# Patient Record
Sex: Female | Born: 1995 | Race: Black or African American | Hispanic: No | Marital: Single | Smoking: Never smoker
Health system: Southern US, Community
[De-identification: ages and names within clinical notes are randomized; demographics above are authoritative.]

## PROBLEM LIST (undated history)

## (undated) DIAGNOSIS — D649 Anemia, unspecified: Secondary | ICD-10-CM

## (undated) DIAGNOSIS — F329 Major depressive disorder, single episode, unspecified: Secondary | ICD-10-CM

## (undated) DIAGNOSIS — F32A Depression, unspecified: Secondary | ICD-10-CM

## (undated) HISTORY — DX: Anemia, unspecified: D64.9

## (undated) HISTORY — PX: NO PAST SURGERIES: SHX2092

---
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
## 2019-06-24 NOTE — L&D Delivery Note (Addendum)
Delivery Note:  IOL G1P0 at [redacted]w[redacted]d  Admitting diagnosis: IUGR (intrauterine growth restriction) affecting care of mother [O36.5990] Risks: IUGR, abnormal doppler Onset of labor: IOL started 01/09/20 at midnight Active labor 1400 IOL/Augmentation: AROM, Pitocin, Cytotec and IP Foley AROM: 01/09/2020 at 2739  Complete dilation at 01/10/2020  0535 01/10/2020 Onset of pushing at 0600 FHR second stage cat 1 and 2, small variables  Analgesia /Anesthesia intrapartum:Epidural  Pushing in various positions with CNM and L&D staff support, sister of patient present for birth and supportive, FOB and extended family present virtually on video.  Delivery of a Live born female Elijah Birth Weight: 2470 g Weight:, English: 5 lb 7.1 oz APGAR: 9, 9  Newborn Delivery   Birth date/time: 01/10/2020 09:03:00 Delivery type: Vaginal, Spontaneous      in cephalic presentation, position OA to ROT.  Nuchal Cord: No  Cord double clamped after cessation of pulsation, cut by sister.  Collection of cord blood for typing completed. Cord blood donation-None  Arterial cord blood sample-No    Placenta delivered-Spontaneous  with 3 vessels . Uterotonics: Pitocin IV bolus Placenta to pathology for IUGR. Uterine tone firm, bleeding small    2nd deg perineal laceration identified.  Episiotomy:None  Local analgesia: NA  Repair: 2.0 Vicryl in standard fashion Est. Blood Loss (mL):156.00   Complications: None   Mom to postpartum.  Baby to Couplet care / Skin to Skin.  Delivery Report:  Review the Delivery Report for details.     Signed: Neta Mends, CNM, MSN 01/10/2020, 9:43 AM

## 2019-09-29 ENCOUNTER — Other Ambulatory Visit (HOSPITAL_COMMUNITY): Payer: Self-pay | Admitting: Obstetrics and Gynecology

## 2019-09-29 LAB — OB RESULTS CONSOLE ABO/RH: RH Type: POSITIVE

## 2019-09-29 LAB — OB RESULTS CONSOLE ANTIBODY SCREEN: Antibody Screen: NEGATIVE

## 2019-09-29 LAB — OB RESULTS CONSOLE RUBELLA ANTIBODY, IGM: Rubella: IMMUNE

## 2019-09-29 LAB — OB RESULTS CONSOLE HEPATITIS B SURFACE ANTIGEN: Hepatitis B Surface Ag: NEGATIVE

## 2019-09-29 LAB — OB RESULTS CONSOLE RPR: RPR: NONREACTIVE

## 2019-09-29 LAB — OB RESULTS CONSOLE HIV ANTIBODY (ROUTINE TESTING): HIV: NONREACTIVE

## 2019-09-30 ENCOUNTER — Other Ambulatory Visit (HOSPITAL_COMMUNITY): Payer: Self-pay | Admitting: Obstetrics and Gynecology

## 2019-09-30 DIAGNOSIS — O283 Abnormal ultrasonic finding on antenatal screening of mother: Secondary | ICD-10-CM

## 2019-09-30 DIAGNOSIS — Z3A24 24 weeks gestation of pregnancy: Secondary | ICD-10-CM

## 2019-09-30 DIAGNOSIS — Z363 Encounter for antenatal screening for malformations: Secondary | ICD-10-CM

## 2019-10-10 ENCOUNTER — Encounter (HOSPITAL_COMMUNITY): Payer: Self-pay | Admitting: *Deleted

## 2019-10-11 ENCOUNTER — Ambulatory Visit (HOSPITAL_COMMUNITY): Payer: Medicaid Other

## 2019-10-11 ENCOUNTER — Encounter (HOSPITAL_COMMUNITY): Payer: Self-pay

## 2019-10-11 ENCOUNTER — Ambulatory Visit (HOSPITAL_COMMUNITY)
Admission: RE | Admit: 2019-10-11 | Discharge: 2019-10-11 | Disposition: A | Discharge status: Home / Self Care | Payer: Medicaid Other | Source: Ambulatory Visit | Attending: Obstetrics and Gynecology | Admitting: Obstetrics and Gynecology

## 2019-10-11 ENCOUNTER — Ambulatory Visit (HOSPITAL_COMMUNITY): Payer: Medicaid Other | Admitting: *Deleted

## 2019-10-11 ENCOUNTER — Other Ambulatory Visit: Payer: Self-pay

## 2019-10-11 VITALS — BP 115/64 | HR 108 | Temp 98.0°F | Ht 62.0 in

## 2019-10-11 DIAGNOSIS — Z3A24 24 weeks gestation of pregnancy: Secondary | ICD-10-CM

## 2019-10-11 DIAGNOSIS — Z363 Encounter for antenatal screening for malformations: Secondary | ICD-10-CM

## 2019-10-11 DIAGNOSIS — O359XX0 Maternal care for (suspected) fetal abnormality and damage, unspecified, not applicable or unspecified: Secondary | ICD-10-CM

## 2019-10-11 DIAGNOSIS — O283 Abnormal ultrasonic finding on antenatal screening of mother: Secondary | ICD-10-CM | POA: Diagnosis present

## 2019-10-11 DIAGNOSIS — E669 Obesity, unspecified: Secondary | ICD-10-CM

## 2019-10-11 DIAGNOSIS — O99212 Obesity complicating pregnancy, second trimester: Secondary | ICD-10-CM

## 2019-10-11 HISTORY — DX: Depression, unspecified: F32.A

## 2019-10-12 ENCOUNTER — Encounter (HOSPITAL_COMMUNITY): Payer: Self-pay | Admitting: Obstetrics and Gynecology

## 2019-10-12 ENCOUNTER — Other Ambulatory Visit (HOSPITAL_COMMUNITY): Payer: Self-pay | Admitting: Obstetrics and Gynecology

## 2019-10-20 ENCOUNTER — Other Ambulatory Visit (HOSPITAL_COMMUNITY): Payer: Self-pay | Admitting: Obstetrics and Gynecology

## 2019-10-20 DIAGNOSIS — O368990 Maternal care for other specified fetal problems, unspecified trimester, not applicable or unspecified: Secondary | ICD-10-CM

## 2019-11-04 ENCOUNTER — Other Ambulatory Visit (HOSPITAL_COMMUNITY): Payer: Self-pay | Admitting: Obstetrics and Gynecology

## 2019-11-04 ENCOUNTER — Ambulatory Visit: Payer: Medicaid Other | Admitting: *Deleted

## 2019-11-04 ENCOUNTER — Encounter: Payer: Self-pay | Admitting: *Deleted

## 2019-11-04 ENCOUNTER — Ambulatory Visit (HOSPITAL_COMMUNITY): Payer: Medicaid Other | Attending: Obstetrics and Gynecology

## 2019-11-04 ENCOUNTER — Other Ambulatory Visit: Payer: Self-pay

## 2019-11-04 VITALS — BP 126/69 | HR 86 | Temp 97.8°F

## 2019-11-04 DIAGNOSIS — O365920 Maternal care for other known or suspected poor fetal growth, second trimester, not applicable or unspecified: Secondary | ICD-10-CM

## 2019-11-04 DIAGNOSIS — Z3A27 27 weeks gestation of pregnancy: Secondary | ICD-10-CM

## 2019-11-04 DIAGNOSIS — O368990 Maternal care for other specified fetal problems, unspecified trimester, not applicable or unspecified: Secondary | ICD-10-CM | POA: Diagnosis present

## 2019-11-04 DIAGNOSIS — Z3689 Encounter for other specified antenatal screening: Secondary | ICD-10-CM | POA: Insufficient documentation

## 2019-11-04 DIAGNOSIS — O289 Unspecified abnormal findings on antenatal screening of mother: Secondary | ICD-10-CM

## 2019-11-04 DIAGNOSIS — Z362 Encounter for other antenatal screening follow-up: Secondary | ICD-10-CM | POA: Diagnosis not present

## 2019-11-04 DIAGNOSIS — E669 Obesity, unspecified: Secondary | ICD-10-CM

## 2019-11-04 DIAGNOSIS — O99212 Obesity complicating pregnancy, second trimester: Secondary | ICD-10-CM | POA: Diagnosis not present

## 2019-11-07 ENCOUNTER — Other Ambulatory Visit: Payer: Self-pay | Admitting: *Deleted

## 2019-11-07 DIAGNOSIS — O365990 Maternal care for other known or suspected poor fetal growth, unspecified trimester, not applicable or unspecified: Secondary | ICD-10-CM

## 2019-11-11 ENCOUNTER — Ambulatory Visit (HOSPITAL_BASED_OUTPATIENT_CLINIC_OR_DEPARTMENT_OTHER): Payer: Medicaid Other

## 2019-11-11 ENCOUNTER — Ambulatory Visit: Payer: Medicaid Other

## 2019-11-11 ENCOUNTER — Ambulatory Visit: Payer: Medicaid Other | Attending: Obstetrics and Gynecology | Admitting: *Deleted

## 2019-11-11 ENCOUNTER — Other Ambulatory Visit: Payer: Self-pay

## 2019-11-11 VITALS — BP 121/67 | HR 104

## 2019-11-11 DIAGNOSIS — O365990 Maternal care for other known or suspected poor fetal growth, unspecified trimester, not applicable or unspecified: Secondary | ICD-10-CM

## 2019-11-11 DIAGNOSIS — O365930 Maternal care for other known or suspected poor fetal growth, third trimester, not applicable or unspecified: Secondary | ICD-10-CM

## 2019-11-11 DIAGNOSIS — O289 Unspecified abnormal findings on antenatal screening of mother: Secondary | ICD-10-CM

## 2019-11-11 DIAGNOSIS — O99213 Obesity complicating pregnancy, third trimester: Secondary | ICD-10-CM | POA: Diagnosis not present

## 2019-11-11 DIAGNOSIS — Z3A28 28 weeks gestation of pregnancy: Secondary | ICD-10-CM | POA: Diagnosis not present

## 2019-11-11 DIAGNOSIS — E669 Obesity, unspecified: Secondary | ICD-10-CM | POA: Diagnosis not present

## 2019-11-11 DIAGNOSIS — O365920 Maternal care for other known or suspected poor fetal growth, second trimester, not applicable or unspecified: Secondary | ICD-10-CM | POA: Diagnosis present

## 2019-11-14 LAB — OB RESULTS CONSOLE HIV ANTIBODY (ROUTINE TESTING): HIV: NONREACTIVE

## 2019-11-17 ENCOUNTER — Ambulatory Visit: Payer: Medicaid Other | Attending: Obstetrics and Gynecology

## 2019-11-17 ENCOUNTER — Ambulatory Visit: Payer: Medicaid Other | Admitting: *Deleted

## 2019-11-17 ENCOUNTER — Encounter: Payer: Self-pay | Admitting: *Deleted

## 2019-11-17 ENCOUNTER — Ambulatory Visit: Payer: Medicaid Other

## 2019-11-17 ENCOUNTER — Other Ambulatory Visit: Payer: Self-pay

## 2019-11-17 VITALS — BP 108/69 | HR 99

## 2019-11-17 DIAGNOSIS — O289 Unspecified abnormal findings on antenatal screening of mother: Secondary | ICD-10-CM

## 2019-11-17 DIAGNOSIS — O365930 Maternal care for other known or suspected poor fetal growth, third trimester, not applicable or unspecified: Secondary | ICD-10-CM

## 2019-11-17 DIAGNOSIS — O365990 Maternal care for other known or suspected poor fetal growth, unspecified trimester, not applicable or unspecified: Secondary | ICD-10-CM | POA: Insufficient documentation

## 2019-11-17 DIAGNOSIS — O99213 Obesity complicating pregnancy, third trimester: Secondary | ICD-10-CM

## 2019-11-17 DIAGNOSIS — Z3A29 29 weeks gestation of pregnancy: Secondary | ICD-10-CM

## 2019-11-17 DIAGNOSIS — E669 Obesity, unspecified: Secondary | ICD-10-CM | POA: Diagnosis not present

## 2019-11-17 NOTE — Procedures (Signed)
Nicole Stewart 06-03-1996 [redacted]w[redacted]d  Fetus A Non-Stress Test Interpretation for 11/17/19  Indication: IUGR  Fetal Heart Rate A Mode: External Baseline Rate (A): 125 bpm Variability: Moderate Accelerations: 10 x 10 Decelerations: None Multiple birth?: No  Uterine Activity Mode: Palpation, Toco Contraction Frequency (min): x1 Contraction Duration (sec): 30 Contraction Quality: Mild Resting Tone Palpated: Relaxed Resting Time: Adequate  Interpretation (Fetal Testing) Nonstress Test Interpretation: Reactive Comments: Reviewed tracing with Dr. Judeth Cornfield

## 2019-11-23 ENCOUNTER — Ambulatory Visit: Payer: Medicaid Other | Attending: Obstetrics and Gynecology

## 2019-11-23 ENCOUNTER — Ambulatory Visit: Payer: Medicaid Other | Admitting: *Deleted

## 2019-11-23 ENCOUNTER — Other Ambulatory Visit: Payer: Self-pay | Admitting: *Deleted

## 2019-11-23 ENCOUNTER — Other Ambulatory Visit: Payer: Self-pay

## 2019-11-23 VITALS — BP 116/68 | HR 83

## 2019-11-23 DIAGNOSIS — O365990 Maternal care for other known or suspected poor fetal growth, unspecified trimester, not applicable or unspecified: Secondary | ICD-10-CM

## 2019-11-23 DIAGNOSIS — O365930 Maternal care for other known or suspected poor fetal growth, third trimester, not applicable or unspecified: Secondary | ICD-10-CM | POA: Diagnosis present

## 2019-11-23 DIAGNOSIS — E669 Obesity, unspecified: Secondary | ICD-10-CM

## 2019-11-23 DIAGNOSIS — O99213 Obesity complicating pregnancy, third trimester: Secondary | ICD-10-CM | POA: Diagnosis not present

## 2019-11-23 DIAGNOSIS — O289 Unspecified abnormal findings on antenatal screening of mother: Secondary | ICD-10-CM | POA: Diagnosis not present

## 2019-11-23 DIAGNOSIS — Z3A30 30 weeks gestation of pregnancy: Secondary | ICD-10-CM

## 2019-11-23 IMAGING — US US MFM OB FOLLOW-UP
1 series · 13 of 28 positions shown · non-contrast
Comparison: none

[Series 1: us mfm ob follow-up · 32 acquisitions, 13 frames shown]
[im 2/32]
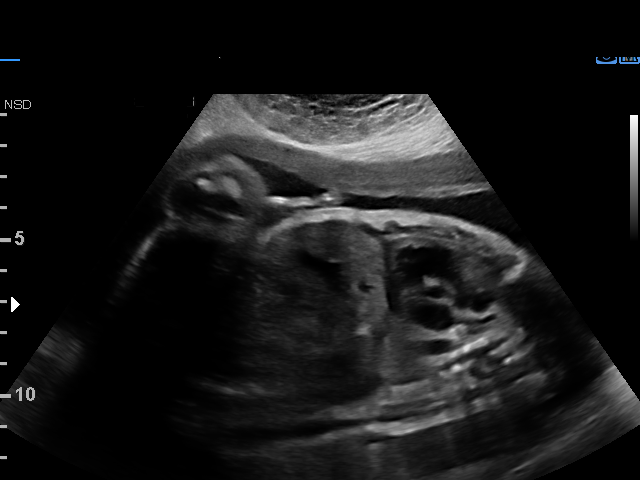
[im 4/32]
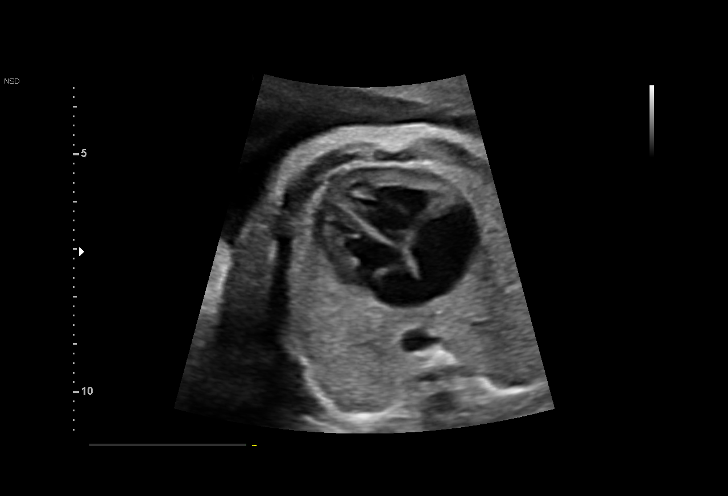
[im 6/32]
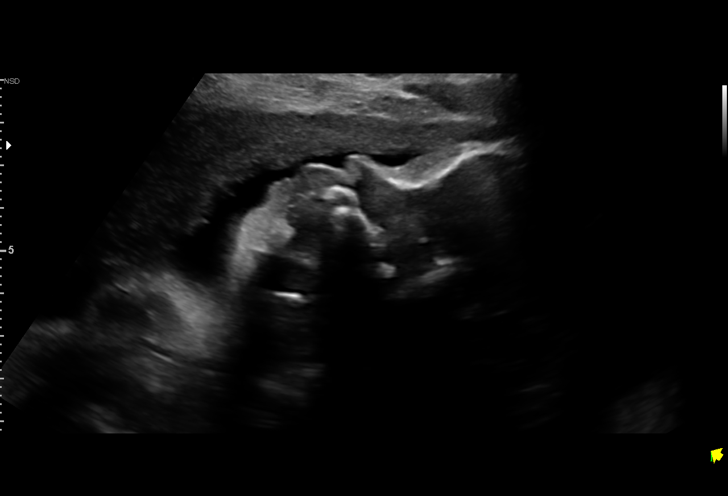
[im 9/32]
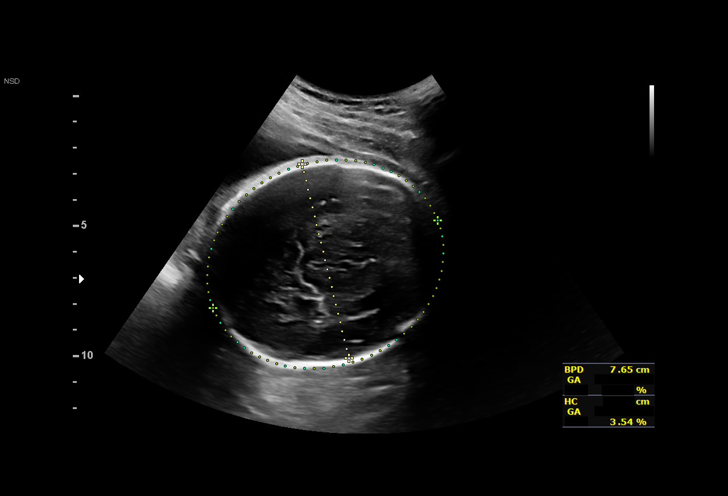
[im 11/32]
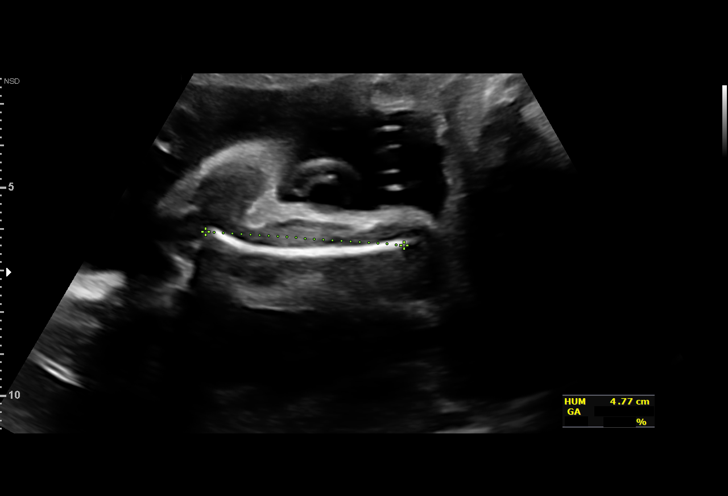
[im 13/32]
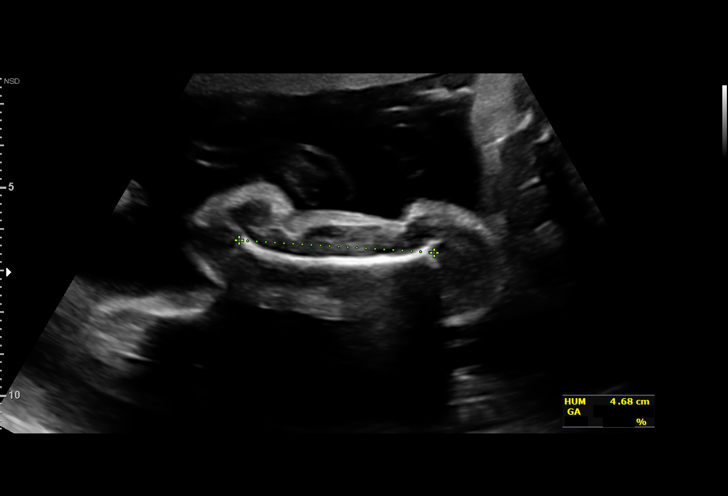
[im 17/32]
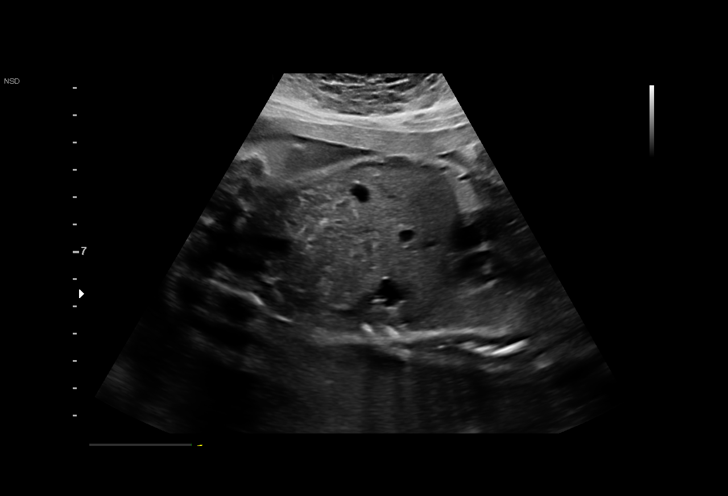
[im 19/32]
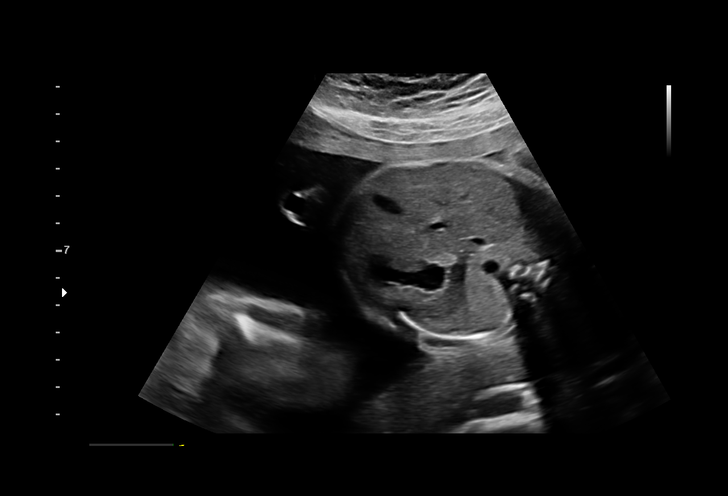
[im 21/32]
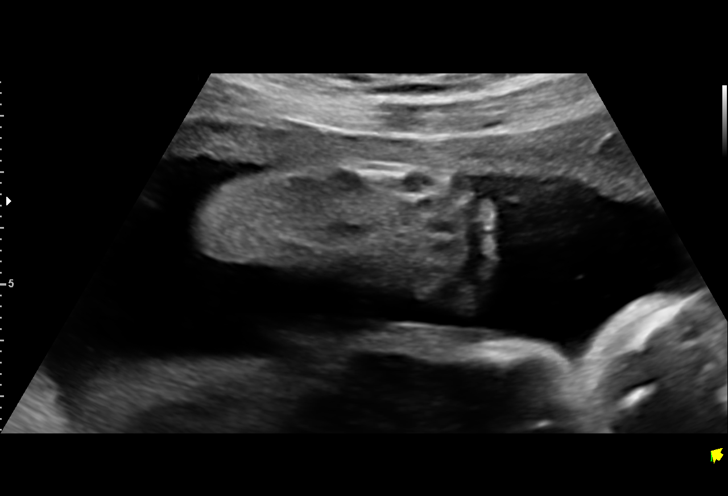
[im 23/32]
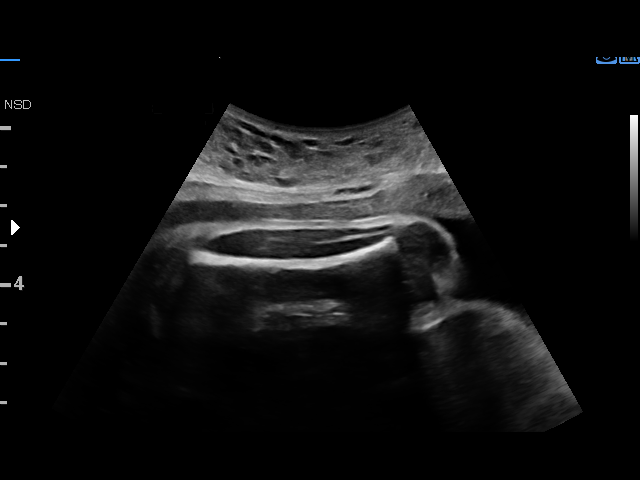
[im 26/32]
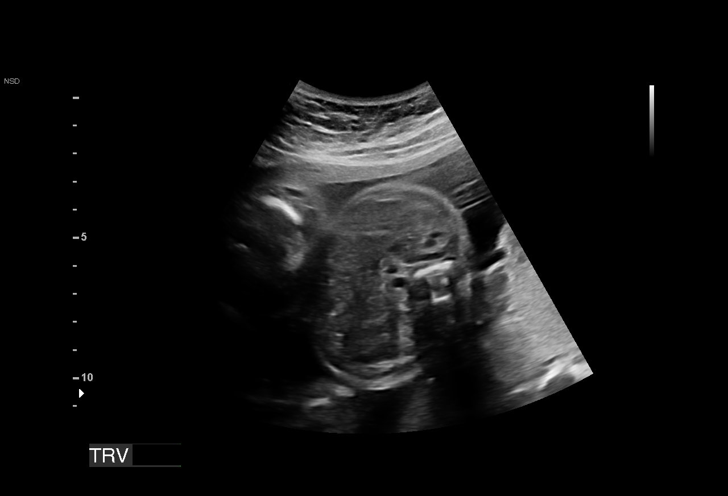
[im 28/32]
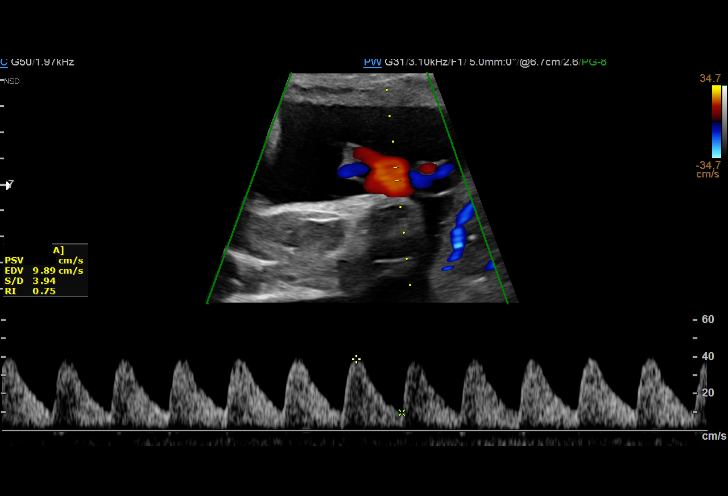
[im 30/32]
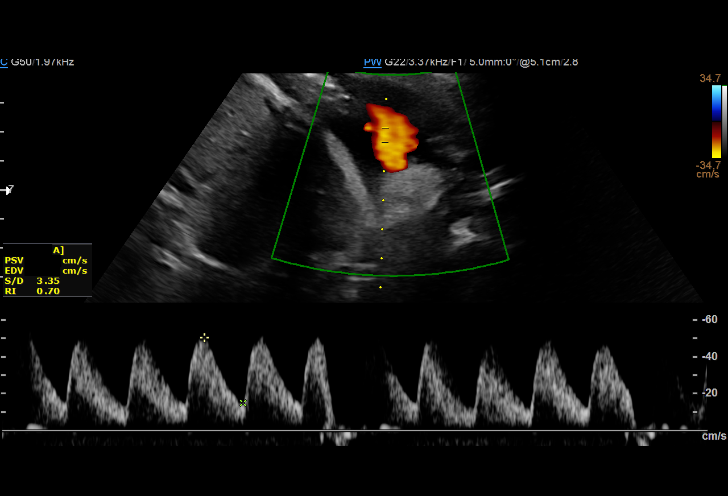

[13 of 28 positions shown; findings below may reference images not displayed]

[facility]                                        Obstetrics &
                                                              Gynecology
                                                              [KK] LIENAD
                                                              LIE[facility]D.

Indications

 Abnormal finding on antenatal screening         [KK]
 (pericardial effusion- RESOLVED, absent
 nasal bone)
 30 weeks gestation of pregnancy
 MS-AFP: Neg/   Panorama:Low risk, Male,
 FF:8.1
 Maternal care for known or suspected poor       [KK]
 fetal growth, third trimester, not applicable or
 unspecified IUGR
 Obesity complicating pregnancy, third trimester [KK]
Vital Signs

                                                 Height:        5'2"
Fetal Evaluation

 Num Of Fetuses:          1
 Fetal Heart Rate(bpm):   134
 Cardiac Activity:        Observed
 Presentation:            Cephalic
 Placenta:                Posterior
 P. Cord Insertion:       Previously Visualized
 Amniotic Fluid
 AFI FV:      Within normal limits

 AFI Sum(cm)     %Tile       Largest Pocket(cm)
 20.9            82

 RUQ(cm)       RLQ(cm)        LUQ(cm)        LLQ(cm)

Biometry

 BPD:      76.7   mm     G. Age:  30w 5d         54  %    CI:         79.81  %    70 - 86
                                                          FL/HC:       19.1  %    19.2 -
 HC:      271.3   mm     G. Age:  29w 4d          6  %    HC/AC:       1.12       0.99 -
 AC:      242.7   mm     G. Age:  28w 4d          6  %    FL/BPD:      67.5  %    71 - 87
 FL:       51.8   mm     G. Age:  27w 5d        < 1  %    FL/AC:       21.3  %    20 - 24
 HUM:      47.5   mm     G. Age:  27w 6d        < 5  %

 LV:         4.6  mm

 Est. FW:    [KK]   gm    2 lb 12 oz     3.3  %
OB History

 Gravidity:     1
Gestational Age

 LMP:            30w 2d       Date:  [DATE]                   EDD:  [DATE]
 U/S Today:      29w 1d                                         EDD:  [DATE]
 Best:           30w 2d    Det. By:  LMP  ([DATE])            EDD:  [DATE]
Anatomy

 Cranium:                Appears normal         Aortic Arch:            Previously seen
 Cavum:                  Previously seen        Ductal Arch:            Previously seen
 Ventricles:             Appears normal         Diaphragm:              Appears normal
 Choroid Plexus:         Previously seen        Stomach:                Appears normal, left
                                                                        sided
 Cerebellum:             Previously seen        Abdomen:                Appears normal
 Posterior Fossa:        Previously seen        Abdominal Wall:         Previously seen
 Nuchal Fold:            Not applicable (>20    Cord Vessels:           Previously seen
                         wks GA)
 Face:                   Orbits previously      Kidneys:                Appear normal
                         seen
 Lips:                   Previously seen        Bladder:                Appears normal
 Thoracic:               Appears normal         Spine:                  Previously seen
 Heart:                  Appears normal         Upper Extremities:      Previously seen
                         (4CH, axis, and situs)
 RVOT:                   Previously seen        Lower Extremities:      Previously seen
 LVOT:                   Previously seen

 Other:   Heels previously seen.
Doppler - Fetal Vessels

 Umbilical Artery
   S/D     %tile                                              ADFV    RDFV
  4.55    > 97.5                                                 No      No
Cervix Uterus Adnexa

 Cervix
 Not visualized (advanced GA >[KK])
Impression

 Follow up growth for IUGR
 Positive interval growth obtained with EFW 3.3 %
 Biophysical profile [DATE] with good amniotic fluid
 Abnormal UA Dopplers are elevated but no evidence of AEDF or
 REDF

 NST performed and reactive
Recommendations

 Continue weekly testing with NST and UA Dopplers
 Repeat growth in 3 weeks
 Given the EFW 3% consider delivery by 37 weeks.

## 2019-11-23 NOTE — Procedures (Signed)
Anushri Casalino Dec 01, 1995 [redacted]w[redacted]d  Fetus A Non-Stress Test Interpretation for 11/23/19  Indication: IUGR  Fetal Heart Rate A Mode: External Baseline Rate (A): 130 bpm Variability: Moderate Accelerations: 10 x 10 Decelerations: None Multiple birth?: No  Uterine Activity Mode: Palpation, Toco Contraction Frequency (min): U/I Contraction Duration (sec): 10-20 Contraction Quality: Mild(none reported) Resting Tone Palpated: Relaxed Resting Time: Adequate  Interpretation (Fetal Testing) Nonstress Test Interpretation: Reactive Comments: Reviewed tracing with Dr. Grace Bushy

## 2019-11-28 ENCOUNTER — Other Ambulatory Visit: Payer: Self-pay | Admitting: *Deleted

## 2019-11-28 DIAGNOSIS — O365930 Maternal care for other known or suspected poor fetal growth, third trimester, not applicable or unspecified: Secondary | ICD-10-CM

## 2019-11-30 ENCOUNTER — Ambulatory Visit (HOSPITAL_BASED_OUTPATIENT_CLINIC_OR_DEPARTMENT_OTHER): Payer: Medicaid Other | Admitting: *Deleted

## 2019-11-30 ENCOUNTER — Ambulatory Visit: Payer: Medicaid Other | Attending: Obstetrics and Gynecology

## 2019-11-30 ENCOUNTER — Other Ambulatory Visit: Payer: Self-pay

## 2019-11-30 ENCOUNTER — Ambulatory Visit: Payer: Medicaid Other | Admitting: *Deleted

## 2019-11-30 VITALS — BP 118/66 | HR 90

## 2019-11-30 DIAGNOSIS — Z3A31 31 weeks gestation of pregnancy: Secondary | ICD-10-CM

## 2019-11-30 DIAGNOSIS — O365931 Maternal care for other known or suspected poor fetal growth, third trimester, fetus 1: Secondary | ICD-10-CM | POA: Diagnosis not present

## 2019-11-30 DIAGNOSIS — O365990 Maternal care for other known or suspected poor fetal growth, unspecified trimester, not applicable or unspecified: Secondary | ICD-10-CM

## 2019-11-30 DIAGNOSIS — O365930 Maternal care for other known or suspected poor fetal growth, third trimester, not applicable or unspecified: Secondary | ICD-10-CM

## 2019-11-30 DIAGNOSIS — E669 Obesity, unspecified: Secondary | ICD-10-CM

## 2019-11-30 DIAGNOSIS — O99213 Obesity complicating pregnancy, third trimester: Secondary | ICD-10-CM | POA: Diagnosis not present

## 2019-11-30 DIAGNOSIS — O289 Unspecified abnormal findings on antenatal screening of mother: Secondary | ICD-10-CM | POA: Diagnosis not present

## 2019-11-30 NOTE — Procedures (Signed)
Nicole Stewart [birth year] [redacted]w[redacted]d  Fetus A Non-Stress Test Interpretation for 11/30/19  Indication: IUGR  Fetal Heart Rate A Mode: External Baseline Rate (A): 135 bpm Variability: Moderate Accelerations: 10 x 10 Decelerations: None Multiple birth?: No  Uterine Activity Mode: Palpation, Toco Contraction Frequency (min): none Resting Tone Palpated: Relaxed Resting Time: Adequate  Interpretation (Fetal Testing) Nonstress Test Interpretation: Reactive Comments: Reviewed tracing with Dr. Parke Poisson

## 2019-12-08 ENCOUNTER — Ambulatory Visit: Payer: Medicaid Other | Attending: Obstetrics and Gynecology

## 2019-12-08 ENCOUNTER — Ambulatory Visit: Payer: Medicaid Other | Admitting: *Deleted

## 2019-12-08 ENCOUNTER — Other Ambulatory Visit: Payer: Self-pay

## 2019-12-08 VITALS — BP 119/63 | HR 83

## 2019-12-08 DIAGNOSIS — O365930 Maternal care for other known or suspected poor fetal growth, third trimester, not applicable or unspecified: Secondary | ICD-10-CM | POA: Insufficient documentation

## 2019-12-08 DIAGNOSIS — O99213 Obesity complicating pregnancy, third trimester: Secondary | ICD-10-CM | POA: Diagnosis not present

## 2019-12-08 DIAGNOSIS — O365990 Maternal care for other known or suspected poor fetal growth, unspecified trimester, not applicable or unspecified: Secondary | ICD-10-CM | POA: Diagnosis present

## 2019-12-08 DIAGNOSIS — E669 Obesity, unspecified: Secondary | ICD-10-CM | POA: Diagnosis not present

## 2019-12-08 DIAGNOSIS — O289 Unspecified abnormal findings on antenatal screening of mother: Secondary | ICD-10-CM

## 2019-12-08 DIAGNOSIS — Z3A32 32 weeks gestation of pregnancy: Secondary | ICD-10-CM

## 2019-12-08 NOTE — Procedures (Signed)
Nicole Stewart 09-04-1995 [redacted]w[redacted]d  Fetus A Non-Stress Test Interpretation for 12/08/19  Indication: IUGR  Fetal Heart Rate A Mode: External Baseline Rate (A): 125 bpm Variability: Moderate Accelerations: 15 x 15 Decelerations: None Multiple birth?: No  Uterine Activity Mode: Palpation, Toco Contraction Frequency (min): none Resting Tone Palpated: Relaxed Resting Time: Adequate  Interpretation (Fetal Testing) Nonstress Test Interpretation: Reactive Comments: Reviewed tracing with Dr. Judeth Cornfield

## 2019-12-10 ENCOUNTER — Encounter (HOSPITAL_COMMUNITY): Payer: Medicaid Other

## 2019-12-15 ENCOUNTER — Ambulatory Visit: Payer: Medicaid Other | Admitting: *Deleted

## 2019-12-15 ENCOUNTER — Ambulatory Visit: Payer: Medicaid Other | Attending: Obstetrics and Gynecology

## 2019-12-15 ENCOUNTER — Other Ambulatory Visit: Payer: Self-pay

## 2019-12-15 ENCOUNTER — Other Ambulatory Visit: Payer: Self-pay | Admitting: *Deleted

## 2019-12-15 VITALS — BP 126/69 | HR 105

## 2019-12-15 DIAGNOSIS — O289 Unspecified abnormal findings on antenatal screening of mother: Secondary | ICD-10-CM | POA: Diagnosis not present

## 2019-12-15 DIAGNOSIS — O365990 Maternal care for other known or suspected poor fetal growth, unspecified trimester, not applicable or unspecified: Secondary | ICD-10-CM | POA: Insufficient documentation

## 2019-12-15 DIAGNOSIS — O365930 Maternal care for other known or suspected poor fetal growth, third trimester, not applicable or unspecified: Secondary | ICD-10-CM

## 2019-12-15 DIAGNOSIS — Z3A33 33 weeks gestation of pregnancy: Secondary | ICD-10-CM

## 2019-12-15 DIAGNOSIS — Z362 Encounter for other antenatal screening follow-up: Secondary | ICD-10-CM | POA: Diagnosis not present

## 2019-12-15 DIAGNOSIS — O99213 Obesity complicating pregnancy, third trimester: Secondary | ICD-10-CM

## 2019-12-15 DIAGNOSIS — E669 Obesity, unspecified: Secondary | ICD-10-CM

## 2019-12-15 NOTE — Procedures (Signed)
Nicole Stewart 1995-09-08 [redacted]w[redacted]d  Fetus A Non-Stress Test Interpretation for 12/15/19  Indication: IUGR  Fetal Heart Rate A Mode: External Baseline Rate (A): 135 bpm Variability: Moderate Accelerations: 15 x 15 Decelerations: None Multiple birth?: No  Uterine Activity Mode: Palpation, Toco Contraction Frequency (min): none Resting Tone Palpated: Relaxed Resting Time: Adequate  Interpretation (Fetal Testing) Nonstress Test Interpretation: Reactive Comments: Reviewed tracing with Dr. Grace Bushy

## 2019-12-21 ENCOUNTER — Ambulatory Visit: Payer: Medicaid Other | Attending: Obstetrics and Gynecology

## 2019-12-21 ENCOUNTER — Ambulatory Visit: Payer: Medicaid Other | Admitting: *Deleted

## 2019-12-21 ENCOUNTER — Other Ambulatory Visit: Payer: Self-pay

## 2019-12-21 VITALS — BP 119/64 | HR 91

## 2019-12-21 DIAGNOSIS — Z3A34 34 weeks gestation of pregnancy: Secondary | ICD-10-CM

## 2019-12-21 DIAGNOSIS — O365930 Maternal care for other known or suspected poor fetal growth, third trimester, not applicable or unspecified: Secondary | ICD-10-CM | POA: Diagnosis not present

## 2019-12-21 DIAGNOSIS — O365990 Maternal care for other known or suspected poor fetal growth, unspecified trimester, not applicable or unspecified: Secondary | ICD-10-CM | POA: Insufficient documentation

## 2019-12-21 DIAGNOSIS — O289 Unspecified abnormal findings on antenatal screening of mother: Secondary | ICD-10-CM | POA: Diagnosis not present

## 2019-12-21 DIAGNOSIS — O99213 Obesity complicating pregnancy, third trimester: Secondary | ICD-10-CM | POA: Diagnosis not present

## 2019-12-21 DIAGNOSIS — E669 Obesity, unspecified: Secondary | ICD-10-CM

## 2019-12-21 DIAGNOSIS — Z362 Encounter for other antenatal screening follow-up: Secondary | ICD-10-CM | POA: Diagnosis not present

## 2019-12-21 NOTE — Procedures (Signed)
Nicole Stewart 1995-09-29 [redacted]w[redacted]d  Fetus A Non-Stress Test Interpretation for 12/21/19  Indication: IUGR  Fetal Heart Rate A Mode: External Baseline Rate (A): 130 bpm Variability: Moderate Accelerations: 15 x 15 Decelerations: None Multiple birth?: No  Uterine Activity Mode: Palpation, Toco Contraction Frequency (min): x2 (with U/I) Contraction Duration (sec): 40-80 Contraction Quality: Mild Resting Tone Palpated: Relaxed Resting Time: Adequate  Interpretation (Fetal Testing) Nonstress Test Interpretation: Reactive Comments: Reviewed tracing with Dr. Judeth Cornfield

## 2019-12-26 ENCOUNTER — Telehealth (HOSPITAL_COMMUNITY): Payer: Self-pay | Admitting: *Deleted

## 2019-12-26 ENCOUNTER — Encounter (HOSPITAL_COMMUNITY): Payer: Self-pay | Admitting: *Deleted

## 2019-12-26 NOTE — Telephone Encounter (Signed)
Preadmission screen  

## 2020-01-03 ENCOUNTER — Other Ambulatory Visit: Payer: Self-pay | Admitting: *Deleted

## 2020-01-03 DIAGNOSIS — O365930 Maternal care for other known or suspected poor fetal growth, third trimester, not applicable or unspecified: Secondary | ICD-10-CM

## 2020-01-03 LAB — OB RESULTS CONSOLE GBS: GBS: NEGATIVE

## 2020-01-04 ENCOUNTER — Other Ambulatory Visit: Payer: Self-pay

## 2020-01-04 ENCOUNTER — Ambulatory Visit: Payer: Medicaid Other | Attending: Obstetrics and Gynecology

## 2020-01-04 ENCOUNTER — Ambulatory Visit: Payer: Medicaid Other | Admitting: *Deleted

## 2020-01-04 VITALS — BP 121/73 | HR 102

## 2020-01-04 DIAGNOSIS — O365990 Maternal care for other known or suspected poor fetal growth, unspecified trimester, not applicable or unspecified: Secondary | ICD-10-CM | POA: Diagnosis present

## 2020-01-04 DIAGNOSIS — O289 Unspecified abnormal findings on antenatal screening of mother: Secondary | ICD-10-CM

## 2020-01-04 DIAGNOSIS — Z362 Encounter for other antenatal screening follow-up: Secondary | ICD-10-CM

## 2020-01-04 DIAGNOSIS — O365930 Maternal care for other known or suspected poor fetal growth, third trimester, not applicable or unspecified: Secondary | ICD-10-CM | POA: Diagnosis not present

## 2020-01-04 DIAGNOSIS — O99213 Obesity complicating pregnancy, third trimester: Secondary | ICD-10-CM | POA: Diagnosis not present

## 2020-01-04 DIAGNOSIS — Z3A36 36 weeks gestation of pregnancy: Secondary | ICD-10-CM

## 2020-01-04 DIAGNOSIS — E669 Obesity, unspecified: Secondary | ICD-10-CM

## 2020-01-07 ENCOUNTER — Other Ambulatory Visit (HOSPITAL_COMMUNITY)
Admission: RE | Admit: 2020-01-07 | Discharge: 2020-01-07 | Disposition: A | Discharge status: Home / Self Care | Payer: Medicaid Other | Source: Ambulatory Visit | Attending: Obstetrics & Gynecology | Admitting: Obstetrics & Gynecology

## 2020-01-07 DIAGNOSIS — Z01818 Encounter for other preprocedural examination: Secondary | ICD-10-CM | POA: Diagnosis present

## 2020-01-07 DIAGNOSIS — Z20822 Contact with and (suspected) exposure to covid-19: Secondary | ICD-10-CM | POA: Diagnosis not present

## 2020-01-07 LAB — SARS CORONAVIRUS 2 (TAT 6-24 HRS): SARS Coronavirus 2: NEGATIVE

## 2020-01-09 ENCOUNTER — Inpatient Hospital Stay (HOSPITAL_COMMUNITY)
Admission: AD | Admit: 2020-01-09 | Discharge: 2020-01-12 | DRG: 806 | Disposition: A | Discharge status: Home / Self Care | Payer: Medicaid Other | Attending: Obstetrics & Gynecology | Admitting: Obstetrics & Gynecology

## 2020-01-09 ENCOUNTER — Inpatient Hospital Stay (HOSPITAL_COMMUNITY): Payer: Medicaid Other | Admitting: Anesthesiology

## 2020-01-09 ENCOUNTER — Other Ambulatory Visit: Payer: Self-pay

## 2020-01-09 ENCOUNTER — Inpatient Hospital Stay (HOSPITAL_COMMUNITY): Payer: Medicaid Other

## 2020-01-09 ENCOUNTER — Encounter (HOSPITAL_COMMUNITY): Payer: Self-pay | Admitting: Obstetrics & Gynecology

## 2020-01-09 DIAGNOSIS — O365930 Maternal care for other known or suspected poor fetal growth, third trimester, not applicable or unspecified: Secondary | ICD-10-CM | POA: Diagnosis present

## 2020-01-09 DIAGNOSIS — Z148 Genetic carrier of other disease: Secondary | ICD-10-CM

## 2020-01-09 DIAGNOSIS — D509 Iron deficiency anemia, unspecified: Secondary | ICD-10-CM | POA: Diagnosis present

## 2020-01-09 DIAGNOSIS — Z3490 Encounter for supervision of normal pregnancy, unspecified, unspecified trimester: Secondary | ICD-10-CM | POA: Diagnosis present

## 2020-01-09 DIAGNOSIS — O9902 Anemia complicating childbirth: Secondary | ICD-10-CM | POA: Diagnosis present

## 2020-01-09 DIAGNOSIS — O864 Pyrexia of unknown origin following delivery: Secondary | ICD-10-CM | POA: Diagnosis not present

## 2020-01-09 DIAGNOSIS — O365990 Maternal care for other known or suspected poor fetal growth, unspecified trimester, not applicable or unspecified: Secondary | ICD-10-CM | POA: Diagnosis present

## 2020-01-09 DIAGNOSIS — Z8659 Personal history of other mental and behavioral disorders: Secondary | ICD-10-CM

## 2020-01-09 DIAGNOSIS — Z3A37 37 weeks gestation of pregnancy: Secondary | ICD-10-CM

## 2020-01-09 LAB — CBC
HCT: 28 % — ABNORMAL LOW (ref 36.0–46.0)
Hemoglobin: 8.6 g/dL — ABNORMAL LOW (ref 12.0–15.0)
MCH: 27.4 pg (ref 26.0–34.0)
MCHC: 30.7 g/dL (ref 30.0–36.0)
MCV: 89.2 fL (ref 80.0–100.0)
Platelets: 346 10*3/uL (ref 150–400)
RBC: 3.14 MIL/uL — ABNORMAL LOW (ref 3.87–5.11)
RDW: 14.6 % (ref 11.5–15.5)
WBC: 7.8 10*3/uL (ref 4.0–10.5)
nRBC: 0 % (ref 0.0–0.2)

## 2020-01-09 LAB — RPR: RPR Ser Ql: NONREACTIVE

## 2020-01-09 LAB — TYPE AND SCREEN
ABO/RH(D): O POS
Antibody Screen: NEGATIVE

## 2020-01-09 LAB — ABO/RH: ABO/RH(D): O POS

## 2020-01-09 MED ORDER — OXYTOCIN BOLUS FROM INFUSION
333.0000 mL | Freq: Once | INTRAVENOUS | Status: AC
  Administered 2020-01-10: 333 mL via INTRAVENOUS

## 2020-01-09 MED ORDER — OXYTOCIN-SODIUM CHLORIDE 30-0.9 UT/500ML-% IV SOLN
2.5000 [IU]/h | INTRAVENOUS | Status: DC
  Filled 2020-01-09: qty 500

## 2020-01-09 MED ORDER — TERBUTALINE SULFATE 1 MG/ML IJ SOLN: 0.2500 mg | Freq: Once | INTRAMUSCULAR | Status: DC | PRN

## 2020-01-09 MED ORDER — OXYCODONE-ACETAMINOPHEN 5-325 MG PO TABS: 2.0000 | ORAL_TABLET | ORAL | Status: DC | PRN

## 2020-01-09 MED ORDER — EPHEDRINE 5 MG/ML INJ: 10.0000 mg | INTRAVENOUS | Status: DC | PRN

## 2020-01-09 MED ORDER — ONDANSETRON HCL 4 MG/2ML IJ SOLN: 4.0000 mg | Freq: Four times a day (QID) | INTRAMUSCULAR | Status: DC | PRN

## 2020-01-09 MED ORDER — SOD CITRATE-CITRIC ACID 500-334 MG/5ML PO SOLN: 30.0000 mL | ORAL | Status: DC | PRN

## 2020-01-09 MED ORDER — LACTATED RINGERS IV SOLN: 500.0000 mL | INTRAVENOUS | Status: DC | PRN

## 2020-01-09 MED ORDER — MISOPROSTOL 25 MCG QUARTER TABLET
25.0000 ug | ORAL_TABLET | ORAL | Status: DC | PRN
  Administered 2020-01-09: 25 ug via VAGINAL
  Filled 2020-01-09: qty 1

## 2020-01-09 MED ORDER — OXYTOCIN-SODIUM CHLORIDE 30-0.9 UT/500ML-% IV SOLN: 1.0000 m[IU]/min | INTRAVENOUS | Status: DC

## 2020-01-09 MED ORDER — OXYTOCIN-SODIUM CHLORIDE 30-0.9 UT/500ML-% IV SOLN
1.0000 m[IU]/min | INTRAVENOUS | Status: DC
  Administered 2020-01-09: 1 m[IU]/min via INTRAVENOUS
  Filled 2020-01-09: qty 500

## 2020-01-09 MED ORDER — PHENYLEPHRINE 40 MCG/ML (10ML) SYRINGE FOR IV PUSH (FOR BLOOD PRESSURE SUPPORT): 80.0000 ug | PREFILLED_SYRINGE | INTRAVENOUS | Status: DC | PRN

## 2020-01-09 MED ORDER — ACETAMINOPHEN 325 MG PO TABS: 650.0000 mg | ORAL_TABLET | ORAL | Status: DC | PRN

## 2020-01-09 MED ORDER — LIDOCAINE HCL (PF) 1 % IJ SOLN: 30.0000 mL | INTRAMUSCULAR | Status: DC | PRN

## 2020-01-09 MED ORDER — LACTATED RINGERS IV SOLN
500.0000 mL | Freq: Once | INTRAVENOUS | Status: AC
  Administered 2020-01-09: 700 mL via INTRAVENOUS

## 2020-01-09 MED ORDER — FENTANYL-BUPIVACAINE-NACL 0.5-0.125-0.9 MG/250ML-% EP SOLN
12.0000 mL/h | EPIDURAL | Status: DC | PRN
  Filled 2020-01-09 (×2): qty 250

## 2020-01-09 MED ORDER — LIDOCAINE HCL (PF) 1 % IJ SOLN
INTRAMUSCULAR | Status: DC | PRN
  Administered 2020-01-09: 3 mL via EPIDURAL
  Administered 2020-01-09: 4 mL via EPIDURAL

## 2020-01-09 MED ORDER — SODIUM CHLORIDE (PF) 0.9 % IJ SOLN
INTRAMUSCULAR | Status: DC | PRN
  Administered 2020-01-09: 12 mL/h via EPIDURAL

## 2020-01-09 MED ORDER — DIPHENHYDRAMINE HCL 50 MG/ML IJ SOLN: 12.5000 mg | INTRAMUSCULAR | Status: DC | PRN

## 2020-01-09 MED ORDER — LACTATED RINGERS IV SOLN: INTRAVENOUS | Status: DC

## 2020-01-09 MED ORDER — OXYCODONE-ACETAMINOPHEN 5-325 MG PO TABS: 1.0000 | ORAL_TABLET | ORAL | Status: DC | PRN

## 2020-01-09 NOTE — H&P (Signed)
OB ADMISSION/ HISTORY & PHYSICAL:  Admission Date: 01/09/2020 12:03 AM  Admit Diagnosis: IOL for IUGR  Nicole Stewart is a 24 y.o. female G1P0 [redacted]w[redacted]d presenting for IOL.   History of current pregnancy: G1P0   Patient late entry to care with [facility] at 23+3 wks.   EDC of 01/30/20 was established by LMP and congruent w/ 22+1 wk U/S.   Anatomy scan:  24+1 wks,  posterior placenta and possible normal variants: fluid around fetal heart and small nasal bone.   Antenatal testing: for IUGR and abnormal dopplers started at 28 weeks Last evaluation: 36+2 wks 6# 1oz 37% (2751 gm), AFI 10.8 Significant prenatal events:  Patient Active Problem List   Diagnosis Date Noted  . IUGR (intrauterine growth restriction) affecting care of mother 01/09/2020  . IDA (iron deficiency anemia) 01/09/2020  . History of depression 01/09/2020  . 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 01/09/2020    Prenatal Labs: ABO, Rh: O/Positive/-- (04/08 0000) Antibody: Negative (04/08 0000) Rubella: Immune (04/08 0000)  RPR: Nonreactive (04/08 0000)  HBsAg: Negative (04/08 0000)  HIV: Non-reactive (04/08 0000)  GTT: passed 1 hr  GBS: Negative/-- (07/13 0000)  GC/CHL: neg/neg Genetics: low-risk female, Horizon positive for SMA carrier, partner declined screening Tdap/influenza vaccines: declined both   OB History  Gravida Para Term Preterm AB Living  1         0  SAB TAB Ectopic Multiple Live Births               # Outcome Date GA Lbr Len/2nd Weight Sex Delivery Anes PTL Lv  1 Current             Medical / Surgical History: Past medical history:  Past Medical History:  Diagnosis Date  . Anemia   . Depression     Past surgical history:  Past Surgical History:  Procedure Laterality Date  . NO PAST SURGERIES     Family History:  Family History  Problem Relation Age of Onset  . Diabetes Mother   . Diabetes Maternal Grandfather     Social History:  reports that she has never smoked. She  has never used smokeless tobacco. She reports that she does not drink alcohol and does not use drugs.  Allergies: Patient has no known allergies.   Current Medications at time of admission:  Prior to Admission medications   Medication Sig Start Date End Date Taking? Authorizing Provider  Ferrous Sulfate (IRON PO) Take by mouth.   Yes [provider]  Prenatal Vit-Fe Fumarate-FA (PRENATAL VITAMIN PO) Take by mouth.   Yes [provider]  Pantoprazole Sodium (PROTONIX PO) Take by mouth.    [provider]  Promethazine HCl (PHENERGAN PO) Take by mouth.    [provider]    Review of Systems: Constitutional: Negative   HENT: Negative   Eyes: Negative   Respiratory: Negative   Cardiovascular: Negative   Gastrointestinal: Negative  Genitourinary: neg for bloody show, neg for LOF   Musculoskeletal: Negative   Skin: Negative   Neurological: Negative   Endo/Heme/Allergies: Negative   Psychiatric/Behavioral: Negative    Physical Exam: VS: Blood pressure 121/82, pulse (!) 108, temperature 98.3 F (36.8 C), temperature source Oral, resp. rate 16, height 5\' 1"  (1.549 m), weight 90.8 kg, last menstrual period 04/25/2019. AAO x3, no signs of distress Cardiovascular: RRR Respiratory: unlabored GU/GI: Abdomen gravid, non-tender, non-distended, active FM, vertex Extremities: no edema, negative for pain, tenderness, and cords  Cervical exam:Dilation: 1  Effacement (%): 30 Station: -3 Exam by:: [employer] RN FHR: baseline rate 125 / variability moderate / accelerations present / absent decelerations TOCO: occasioanl ctx   Prenatal Transfer Tool  Maternal Diabetes: No Genetic Screening: Normal Horizon positive for SMA carrier Maternal Ultrasounds/Referrals: IUGR Fetal Ultrasounds or other Referrals:  Referred to Materal Fetal Medicine  Maternal Substance Abuse:  No Significant Maternal Medications:  None Significant Maternal Lab Results: Group  B Strep negative    Assessment: 24 y.o. G1P0 [redacted]w[redacted]d  IOL for IUGR    -growth as low as 3.6%ile, last eval @ 36+2 wks 6# 1oz (2751g) 37%ile, BPP 8/8    -MFM rec delivery @ 37 wks for severe FGR on prev scans FHR category 1 GBS negative Pain management plan: epidural   Plan:  Admit to L&D Routine admission orders Cytotec PV Epidural PRN  Dr Dion Body to be notified of admission and plan of care  Roma Schanz MSN, CNM 01/09/2020 12:38 AM

## 2020-01-09 NOTE — Anesthesia Preprocedure Evaluation (Signed)
Anesthesia Evaluation  Patient identified by MRN, date of birth, ID band Patient awake    Reviewed: Allergy & Precautions, H&P , NPO status , Patient's Chart, lab work & pertinent test results  History of Anesthesia Complications Negative for: history of anesthetic complications  Airway Mallampati: II  TM Distance: >3 FB Neck ROM: full    Dental no notable dental hx.    Pulmonary neg pulmonary ROS,    Pulmonary exam normal        Cardiovascular negative cardio ROS Normal cardiovascular exam Rhythm:regular Rate:Normal     Neuro/Psych negative neurological ROS  negative psych ROS   GI/Hepatic negative GI ROS, Neg liver ROS,   Endo/Other  negative endocrine ROS  Renal/GU negative Renal ROS  negative genitourinary   Musculoskeletal   Abdominal   Peds  Hematology  (+) Blood dyscrasia, anemia ,   Anesthesia Other Findings   Reproductive/Obstetrics (+) Pregnancy                             Anesthesia Physical Anesthesia Plan  ASA: III  Anesthesia Plan: Epidural   Post-op Pain Management:    Induction:   PONV Risk Score and Plan:   Airway Management Planned:   Additional Equipment:   Intra-op Plan:   Post-operative Plan:   Informed Consent: I have reviewed the patients History and Physical, chart, labs and discussed the procedure including the risks, benefits and alternatives for the proposed anesthesia with the patient or authorized representative who has indicated his/her understanding and acceptance.       Plan Discussed with:   Anesthesia Plan Comments:         Anesthesia Quick Evaluation

## 2020-01-09 NOTE — Progress Notes (Signed)
Nettye Donner is a 24 y.o. G1P0 at [redacted]w[redacted]d admitted for induction of labor due to IUGR.   Subjective: Comfortable with epidural Pitocin infusing S/p cytotec and cooks; expelled at 1424    Objective: BP 129/68   Pulse 97   Temp 99 F (37.2 C) (Oral)   Resp 18   Ht 5\' 1"  (1.549 m)   Wt 90.8 kg   LMP 04/25/2019   SpO2 99%   BMI 37.83 kg/m  No intake/output data recorded. Total I/O In: -  Out: 250 [Urine:250]  FHT:  FHR: 135 bpm, variability: moderate,  accelerations:  Present,  decelerations:  Absent UC:   regular, every 1.5-3 minutes SVE: 6/75/-2 AROM 1739 Narrow pelvic inlet  Labs: Lab Results  Component Value Date   WBC 7.8 01/09/2020   HGB 8.6 (L) 01/09/2020   HCT 28.0 (L) 01/09/2020   MCV 89.2 01/09/2020   PLT 346 01/09/2020    Assessment / Plan: Induction of labor due to IUGR,  progressing well on pitocin  Labor: AROM complete. Continue pitocin per protocol.  Fetal Wellbeing:  Category I Pain Control: comfortable with epidural I/D:  GBS negative Anticipated MOD:  NSVD  01/11/2020 Antanisha Mohs MSN, CNM 01/09/2020, 5:18 PM

## 2020-01-09 NOTE — Progress Notes (Addendum)
Nicole Stewart is a 24 y.o. G1P0 at [redacted]w[redacted]d admitted for induction of labor due to IUGR.   Subjective: Contractions painful, desires epidural    Objective: BP 122/64 (BP Location: Left Arm)   Pulse 96   Temp 98.5 F (36.9 C) (Oral)   Resp 18   Ht 5\' 1"  (1.549 m)   Wt 90.8 kg   LMP 04/25/2019   SpO2 99%   BMI 37.83 kg/m  No intake/output data recorded. Total I/O In: -  Out: 250 [Urine:250]  FHT:  FHR: 135 bpm, variability: moderate,  accelerations:  Present,  decelerations:  Absent UC:   regular, every 1.5-3 minutes SVE:  3/70/-2 per RN exam with cooks in place Labs: Lab Results  Component Value Date   WBC 7.8 01/09/2020   HGB 8.6 (L) 01/09/2020   HCT 28.0 (L) 01/09/2020   MCV 89.2 01/09/2020   PLT 346 01/09/2020    Assessment / Plan: Induction of labor due to IUGR,  progressing well on pitocin  Labor: S/P Cytotec x1, cervical balloon inserted 80 ml uterine, 60 ml vaginal. Fetal Wellbeing:  Category I Pain Control: Desires epidural at this time I/D:  GBS negative Anticipated MOD:  NSVD  01/11/2020 Tiffany Calmes MSN, CNM 01/09/2020, 11:46 AM

## 2020-01-09 NOTE — Anesthesia Procedure Notes (Signed)
Epidural Patient location during procedure: OB  Staffing Anesthesiologist: Lucretia Kern, MD Performed: anesthesiologist   Preanesthetic Checklist Completed: patient identified, IV checked, risks and benefits discussed, monitors and equipment checked, pre-op evaluation and timeout performed  Epidural Patient position: sitting Prep: DuraPrep Patient monitoring: heart rate, continuous pulse ox and blood pressure Approach: midline Location: L3-L4 Injection technique: LOR air  Needle:  Needle type: Tuohy  Needle gauge: 17 G Needle length: 9 cm Needle insertion depth: 8 cm Catheter type: closed end flexible Catheter size: 19 Gauge Catheter at skin depth: 13 cm Test dose: negative  Assessment Events: blood not aspirated, injection not painful, no injection resistance, no paresthesia and negative IV test  Additional Notes Reason for block:procedure for pain

## 2020-01-09 NOTE — Progress Notes (Signed)
Nicole Stewart is a 24 y.o. G1P0 at [redacted]w[redacted]d admitted for induction of labor due to IUGR.  Subjective: Nicole Stewart is feeling intermittent pressure.  Family in room Objective: BP 120/64   Pulse (!) 109   Temp 98.7 F (37.1 C) (Oral)   Resp 17   Ht 5\' 1"  (1.549 m)   Wt 90.8 kg   LMP 04/25/2019   SpO2 99%   BMI 37.83 kg/m  I/O last 3 completed shifts: In: -  Out: 250 [Urine:250] Total I/O In: -  Out: 1125 [Urine:1125]  FHT: Category 1 FHT 135 with variability present, accels noted, some early decels UC:   regular, every 2 minutes SVE:   Dilation: 6 Effacement (%): 80 Station: -2 Exam by:: Susana Gripp CNM Pitocin at 14 mu IUPC inserted without difficulty.  Pt tolerated procedure well. Balancing completed with side lying release Assessment:  Nicole Stewart is a 24 y.o. G1P0 at [redacted]w[redacted]d admitted for induction of labor due to IUGR Cat 1 strip  Plan: Monitor progress Anticipate SVD  [redacted]w[redacted]d CNM, MSN 01/09/2020, 9:01 PM

## 2020-01-09 NOTE — Progress Notes (Addendum)
Nicole Stewart is a 24 y.o. G1P0 at [redacted]w[redacted]d admitted for induction of labor due to IUGR.   Subjective: Comfortable with epidural Pitocin infusing  Objective: BP (!) 98/53 (BP Location: Right Arm)   Pulse (!) 109   Temp 98.5 F (36.9 C) (Oral)   Resp 20   Ht 5\' 1"  (1.549 m)   Wt 90.8 kg   LMP 04/25/2019   SpO2 99%   BMI 37.83 kg/m  No intake/output data recorded. Total I/O In: -  Out: 250 [Urine:250]  FHT:  FHR: 135 bpm, variability: moderate,  accelerations:  Present,  decelerations:  Absent UC:   regular, every 1.5-3 minutes SVE: 6/70/-2 cooks expelled  Labs: Lab Results  Component Value Date   WBC 7.8 01/09/2020   HGB 8.6 (L) 01/09/2020   HCT 28.0 (L) 01/09/2020   MCV 89.2 01/09/2020   PLT 346 01/09/2020    Assessment / Plan: Induction of labor due to IUGR,  progressing well on pitocin  Labor: S/P Cytotec x1, cervical balloon inserted 80 ml uterine, 60 ml vaginal. Progressing well on pitocin. Cooks now expelled per 01/11/2020. AROM at next SVE Fetal Wellbeing:  Category I Pain Control: comfortable with epidural I/D:  GBS negative Anticipated MOD:  NSVD  Charity fundraiser Crumpler MSN, CNM 01/09/2020, 2:06 PM  MD Addendum: I reviewed chart and agree with above findings, assessment and plan as outlined above by 01/11/2020, MSN CNM.  Dr. Verdis Prime. 01/09/2020.   1542.

## 2020-01-09 NOTE — Progress Notes (Signed)
Nicole Stewart is a 24 y.o. G1P0 at [redacted]w[redacted]d admitted for induction of labor due to IUGR.   Subjective: Contracting too frequently for repeat Cytotec. Discussed plan to insert cervical balloon and pt agrees. Pt tolerated well.    Objective: BP (!) 113/55   Pulse 79   Temp 98.3 F (36.8 C) (Oral)   Resp 16   Ht 5\' 1"  (1.549 m)   Wt 90.8 kg   LMP 04/25/2019   SpO2 100%   BMI 37.83 kg/m  No intake/output data recorded. No intake/output data recorded.  FHT:  FHR: 135 bpm, variability: moderate,  accelerations:  Present,  decelerations:  Absent UC:   regular, every 1.5-3 minutes SVE:   Dilation: 2 Effacement (%): 50 Station: -3 Exam by:: [email or web address], CNM  Labs: Lab Results  Component Value Date   WBC 7.8 01/09/2020   HGB 8.6 (L) 01/09/2020   HCT 28.0 (L) 01/09/2020   MCV 89.2 01/09/2020   PLT 346 01/09/2020    Assessment / Plan: Induction of labor due to IUGR,  progressing well on pitocin  Labor: S/P Cytotec x1, cervical balloon inserted 80 ml uterine, 60 ml vaginal. Fetal Wellbeing:  Category I Pain Control:  plans epidural in active labor I/D:  GBS negative Anticipated MOD:  NSVD  01/11/2020 MSN, CNM 01/09/2020, 6:44 AM

## 2020-01-10 ENCOUNTER — Encounter (HOSPITAL_COMMUNITY): Payer: Self-pay | Admitting: Obstetrics & Gynecology

## 2020-01-10 DIAGNOSIS — Z3490 Encounter for supervision of normal pregnancy, unspecified, unspecified trimester: Secondary | ICD-10-CM | POA: Diagnosis present

## 2020-01-10 MED ORDER — DIPHENHYDRAMINE HCL 25 MG PO CAPS: 25.0000 mg | ORAL_CAPSULE | Freq: Four times a day (QID) | ORAL | Status: DC | PRN

## 2020-01-10 MED ORDER — BENZOCAINE-MENTHOL 20-0.5 % EX AERO
1.0000 "application " | INHALATION_SPRAY | CUTANEOUS | Status: DC | PRN
  Administered 2020-01-10: 1 via TOPICAL
  Filled 2020-01-10: qty 56

## 2020-01-10 MED ORDER — WITCH HAZEL-GLYCERIN EX PADS: 1.0000 "application " | MEDICATED_PAD | CUTANEOUS | Status: DC | PRN

## 2020-01-10 MED ORDER — ZOLPIDEM TARTRATE 5 MG PO TABS: 5.0000 mg | ORAL_TABLET | Freq: Every evening | ORAL | Status: DC | PRN

## 2020-01-10 MED ORDER — IBUPROFEN 600 MG PO TABS
600.0000 mg | ORAL_TABLET | Freq: Four times a day (QID) | ORAL | Status: DC
  Administered 2020-01-10 – 2020-01-12 (×6): 600 mg via ORAL
  Filled 2020-01-10 (×7): qty 1

## 2020-01-10 MED ORDER — FLEET ENEMA 7-19 GM/118ML RE ENEM: 1.0000 | ENEMA | Freq: Every day | RECTAL | Status: DC | PRN

## 2020-01-10 MED ORDER — BISACODYL 10 MG RE SUPP: 10.0000 mg | Freq: Every day | RECTAL | Status: DC | PRN

## 2020-01-10 MED ORDER — TETANUS-DIPHTH-ACELL PERTUSSIS 5-2.5-18.5 LF-MCG/0.5 IM SUSP: 0.5000 mL | Freq: Once | INTRAMUSCULAR | Status: DC

## 2020-01-10 MED ORDER — ONDANSETRON HCL 4 MG/2ML IJ SOLN: 4.0000 mg | INTRAMUSCULAR | Status: DC | PRN

## 2020-01-10 MED ORDER — SIMETHICONE 80 MG PO CHEW: 80.0000 mg | CHEWABLE_TABLET | ORAL | Status: DC | PRN

## 2020-01-10 MED ORDER — PRENATAL MULTIVITAMIN CH
1.0000 | ORAL_TABLET | Freq: Every day | ORAL | Status: DC
  Administered 2020-01-11: 1 via ORAL
  Filled 2020-01-10: qty 1

## 2020-01-10 MED ORDER — ONDANSETRON HCL 4 MG PO TABS: 4.0000 mg | ORAL_TABLET | ORAL | Status: DC | PRN

## 2020-01-10 MED ORDER — DIBUCAINE (PERIANAL) 1 % EX OINT: 1.0000 "application " | TOPICAL_OINTMENT | CUTANEOUS | Status: DC | PRN

## 2020-01-10 MED ORDER — COCONUT OIL OIL: 1.0000 "application " | TOPICAL_OIL | Status: DC | PRN

## 2020-01-10 MED ORDER — ACETAMINOPHEN 500 MG PO TABS
1000.0000 mg | ORAL_TABLET | Freq: Four times a day (QID) | ORAL | Status: DC
  Administered 2020-01-10 – 2020-01-12 (×6): 1000 mg via ORAL
  Filled 2020-01-10 (×6): qty 2

## 2020-01-10 MED ORDER — SENNOSIDES-DOCUSATE SODIUM 8.6-50 MG PO TABS
2.0000 | ORAL_TABLET | ORAL | Status: DC
  Administered 2020-01-10 – 2020-01-11 (×2): 2 via ORAL
  Filled 2020-01-10 (×2): qty 2

## 2020-01-10 NOTE — Progress Notes (Signed)
Subjective: Shiryl is pushing with contractions. Support in room.  Has been pushing for one hours in different positions  Objective: BP (!) 130/97   Pulse (!) 127   Temp 99.2 F (37.3 C) (Oral)   Resp 19   Ht 5\' 1"  (1.549 m)   Wt 90.8 kg   LMP 04/25/2019   SpO2 98%   BMI 37.83 kg/m  I/O last 3 completed shifts: In: -  Out: 1975 [Urine:1975] No intake/output data recorded.  FHT: Category 1 FHT 135 variability present, no decels UC:   regular, every 2-3 minutes SVE:   Dilation: 10 Effacement (%): 100 Station: Plus 2, Plus 3 Exam by:: H SIms RN Pitocin at 17 mu   Assessment:  Aadya Isaacis a 24 y.o.G1P0 at [redacted]w[redacted]d admitted for induction of labor due to IUGR Cat 1 strip  Plan: Continue pushing  Anticpate SVD  [redacted]w[redacted]d CNM, MSN 01/10/2020, 7:15 AM

## 2020-01-10 NOTE — Anesthesia Postprocedure Evaluation (Signed)
Anesthesia Post Note  Patient: Nicole Stewart  Procedure(s) Performed: AN AD HOC LABOR EPIDURAL     Patient location during evaluation: Mother Baby Anesthesia Type: Epidural Level of consciousness: awake and alert Pain management: pain level controlled Vital Signs Assessment: post-procedure vital signs reviewed and stable Respiratory status: spontaneous breathing, nonlabored ventilation and respiratory function stable Cardiovascular status: stable Postop Assessment: no headache, no backache and epidural receding Anesthetic complications: no   No complications documented.  Last Vitals:  Vitals:   01/10/20 1215 01/10/20 1521  BP: 126/66 116/63  Pulse: 80 (!) 112  Resp: 18 18  Temp: 37.6 C 37.2 C  SpO2: 100% 98%    Last Pain:  Vitals:   01/10/20 1521  TempSrc: Oral   Pain Goal:                Epidural/Spinal Function Cutaneous sensation: Normal sensation (01/10/20 1527), Patient able to flex knees: Yes (01/10/20 1527), Patient able to lift hips off bed: Yes (01/10/20 1527), Back pain beyond tenderness at insertion site: No (01/10/20 1527), Progressively worsening motor and/or sensory loss: No (01/10/20 1527), Bowel and/or bladder incontinence post epidural: No (01/10/20 1527)  [name]

## 2020-01-11 LAB — CBC
HCT: 23.7 % — ABNORMAL LOW (ref 36.0–46.0)
Hemoglobin: 7.2 g/dL — ABNORMAL LOW (ref 12.0–15.0)
MCH: 27.4 pg (ref 26.0–34.0)
MCHC: 30.4 g/dL (ref 30.0–36.0)
MCV: 90.1 fL (ref 80.0–100.0)
Platelets: 254 10*3/uL (ref 150–400)
RBC: 2.63 MIL/uL — ABNORMAL LOW (ref 3.87–5.11)
RDW: 15.1 % (ref 11.5–15.5)
WBC: 13.8 10*3/uL — ABNORMAL HIGH (ref 4.0–10.5)
nRBC: 0 % (ref 0.0–0.2)

## 2020-01-11 MED ORDER — SODIUM CHLORIDE 0.9 % IV SOLN
510.0000 mg | Freq: Once | INTRAVENOUS | Status: AC
  Administered 2020-01-11: 510 mg via INTRAVENOUS
  Filled 2020-01-11: qty 17

## 2020-01-11 MED ORDER — POLYSACCHARIDE IRON COMPLEX 150 MG PO CAPS
150.0000 mg | ORAL_CAPSULE | Freq: Every day | ORAL | Status: DC
  Filled 2020-01-11: qty 1

## 2020-01-11 MED ORDER — SODIUM CHLORIDE 0.9 % IV SOLN
3.0000 g | Freq: Four times a day (QID) | INTRAVENOUS | Status: DC
  Administered 2020-01-11 – 2020-01-12 (×3): 3 g via INTRAVENOUS
  Filled 2020-01-11 (×5): qty 8
  Filled 2020-01-11: qty 3

## 2020-01-11 MED ORDER — MAGNESIUM OXIDE 400 (241.3 MG) MG PO TABS
400.0000 mg | ORAL_TABLET | Freq: Every day | ORAL | Status: DC
  Filled 2020-01-11: qty 1

## 2020-01-11 NOTE — Lactation Note (Addendum)
This note was copied from a baby's chart. Lactation Consultation Note  Patient Name: Nicole Stewart [id number] Date: 01/11/2020  [facility] entered room and baby in crib and mom in bed.  Baby Nicole Neita Goodnight now 27 hours old.  Born at 37 weeks and 1 day gestation and IUGR/less than 6 pounds.  Mom reports her goal is to breastfeed with occasional breastmilk or formula in a bottle. Mom reports she got a DEBP [place] for shower gift.  Asked mom what time he last ate and it has been close to three hours.  Asked if I could assist with breastfeeding.  Mom agreed.  Assisted in cross cradlle hold.  Infant unable to maintain.  Will latch and take a few sucks and let go and cry/  Switched him to football.  Assisted in football. He would open and try.  Mom would put her fingers too close to nipple.  After a few attempts.  Left STS.  Inititiated pumping with DEBP with mom. Pumped on left breast while mom getting IV worked on on the right arm.  Infant can stick tongue over gum line but unable to elevate tongue.  Questionable tight frenulum. Urged mom to keep working with him.  Let her know would check on them later.  Mom on [facility] in [place], [facility] referral sent.  Urged mom to call lactation as needed.         Consult Status Consult Status: Follow-up Date: 01/11/20 Follow-up type: In-patient    Ksean Vale Michaelle Copas 01/11/2020, 2:21 PM

## 2020-01-11 NOTE — Progress Notes (Addendum)
PPD # 1 S/P NSVD  Live born female  Birth Weight: 5 lb 7.1 oz (2470 g) APGAR: 9, 9  Newborn Delivery   Birth date/time: 01/10/2020 09:03:00 Delivery type: Vaginal, Spontaneous     Baby name: Nicole Stewart  Episiotomy:None   Lacerations:2nd degree;Perineal   Circumcision: desires, will defer to pediatrics for assessment  Feeding: bottle  Pain control at delivery: Epidural   S:  Reports feeling tired and sore. Pain is well controlled with Motrin. Rates pain 4/10 on pain scale.              Tolerating po/ No nausea or vomiting             Bleeding is moderate             Pain controlled with acetaminophen and ibuprofen (OTC)             Up ad lib / ambulatory / voiding without difficulties   O:  A & O x 3, in no apparent distress              VS:  Vitals:   01/10/20 1215 01/10/20 1521 01/10/20 2119 01/11/20 0238  BP: 126/66 116/63 (!) 117/59 115/64  Pulse: 80 (!) 112 93 75  Resp: 18 18 18    Temp: 99.6 F (37.6 Stewart) 98.9 F (37.2 Stewart) 98.4 F (36.9 Stewart) 97.8 F (36.6 Stewart)  TempSrc: Oral Oral Oral   SpO2: 100% 98%    Weight:      Height:        LABS:  Recent Labs    01/09/20 0030 01/11/20 0612  WBC 7.8 13.8*  HGB 8.6* 7.2*  HCT 28.0* 23.7*  PLT 346 254    Blood type: --/--/O POS Performed at [facility], [address] [place], [place]  [phone] (35/36)  Rubella: Immune (04/08 0000)   I&O: I/O last 3 completed shifts: In: 0  Out: 2518 [Urine:2325; Blood:193]          No intake/output data recorded.   Gen: AAO x 3, NAD  Abdomen: soft, non-tender, non-distended             Fundus: firm, non-tender, U-1  Perineum: healing, no edema or hematoma  Lochia: moderate  Extremities: no edema, no calf pain or tenderness   A/P: PPD # 1 24 y.o., G1P1001   Principal Problem:   Postpartum care following vaginal delivery 7/20 Active Problems:   IUGR (intrauterine growth restriction) affecting care of mother   IDA (iron deficiency  anemia)   History of depression   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   Encounter for planned induction of labor   SVD (spontaneous vaginal delivery)   Perineal laceration, second degree  Anemia  - Admission hemoglobin 8.6, dropped to 7.2, pt is asymptomatic.   - Will order IV Feraheme and follow with PO Niferex and mag ox to start tomorrow.  - CBC in the AM Postpartum Fever  - Pt had a temperature of 100.58F at 1120 yesterday  - Has remained afebrile for 24 hours  - Per Dr. 8/20 will order Unasyn 3gm IVPB History of depression    - Case management to follow patient due to history of depression  - No medications at this time  - Follow-up in the office at 2 weeks postpartum for mood check  Anticipate discharge tomorrow.   Nicole Appl, MSN, CNM 01/11/2020, 11:40 AM

## 2020-01-11 NOTE — Progress Notes (Signed)
CSW received consult for hx of Depression.  CSW met with MOB to offer support and complete assessment.    CSW congratulated MOB on the birth of infant. CSW advised MOB of CSW's role and the reason for CSW coming to visit with her. MOB reported that she was diagnosed with her depression in her younger years. MOB reported that when she was child she would be woken up out of her sleep with depression. MOB reported that she has never been on medications for her depression and doesn't see a therapist, MOB reported an interest in parent groups. CSW understanding and provided MOB with potential resources for this. MOB reported that she doesn't have any other mental health hx and denies SI, HI and DV to this CSW. MOB reported that she has all needed items to care for infant with no other needs.   CSW provided education regarding the baby blues period vs. perinatal mood disorders, discussed treatment. CSW recommends self-evaluation during the postpartum time period using the New Mom Checklist from Postpartum Progress and encouraged MOB to contact a medical professional if symptoms are noted at any time.   CSW provided review of Sudden Infant Death Syndrome (SIDS) precautions.    CSW identifies no further need for intervention and no barriers to discharge at this time.   Nicole Stewart Nicole Stewart, MSW, LCSW [facility] [place] [phone]

## 2020-01-12 LAB — CBC
HCT: 24.5 % — ABNORMAL LOW (ref 36.0–46.0)
Hemoglobin: 7.4 g/dL — ABNORMAL LOW (ref 12.0–15.0)
MCH: 27.6 pg (ref 26.0–34.0)
MCHC: 30.2 g/dL (ref 30.0–36.0)
MCV: 91.4 fL (ref 80.0–100.0)
Platelets: 268 10*3/uL (ref 150–400)
RBC: 2.68 MIL/uL — ABNORMAL LOW (ref 3.87–5.11)
RDW: 15.1 % (ref 11.5–15.5)
WBC: 10.3 10*3/uL (ref 4.0–10.5)
nRBC: 0 % (ref 0.0–0.2)

## 2020-01-12 LAB — SURGICAL PATHOLOGY

## 2020-01-12 MED ORDER — POLYSACCHARIDE IRON COMPLEX 150 MG PO CAPS: 150.0000 mg | ORAL_CAPSULE | Freq: Every day | ORAL | 2 refills | Status: AC

## 2020-01-12 MED ORDER — SENNOSIDES-DOCUSATE SODIUM 8.6-50 MG PO TABS: 2.0000 | ORAL_TABLET | ORAL | 0 refills | Status: AC

## 2020-01-12 MED ORDER — IBUPROFEN 600 MG PO TABS: 600.0000 mg | ORAL_TABLET | Freq: Four times a day (QID) | ORAL | 0 refills | Status: AC

## 2020-01-12 NOTE — Discharge Summary (Signed)
SVD OB Discharge Summary     Patient Name: Nicole Stewart DOB: 1995/11/02 MRN: [id number]  Date of admission: 01/09/2020 Delivering MD: Neta Mends  Date of delivery: 01/10/2020 Type of delivery: S/VD  Newborn Data: Sex: Baby female Circumcision: out pt desired after urology assessment Live born female  Birth Weight: 5 lb 7.1 oz (2470 g) APGAR: 9, 9  Newborn Delivery   Birth date/time: 01/10/2020 09:03:00 Delivery type: Vaginal, Spontaneous      Feeding: breast and bottle Infant being discharge to home with mother in stable condition.   Admitting diagnosis: IUGR (intrauterine growth restriction) affecting care of mother [O36.5990] Intrauterine pregnancy: [redacted]w[redacted]d     Secondary diagnosis:  Principal Problem:   Postpartum care following vaginal delivery 7/20 Active Problems:   IUGR (intrauterine growth restriction) affecting care of mother   IDA (iron deficiency anemia)   History of depression   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   Encounter for planned induction of labor   SVD (spontaneous vaginal delivery)   Perineal laceration, second degree                                Complications: None                                                              Intrapartum Procedures: GBS prophylaxis Postpartum Procedures: antibiotics Complications-Operative and Postpartum: 2nd degree perineal laceration Augmentation: AROM, Pitocin, Cytotec and IP Foley   History of Present Illness: Nicole Stewart is a 24 y.o. female, G1P1001, who presents at [redacted]w[redacted]d weeks gestation. The patient has been followed at  [facility]  Her pregnancy has been complicated by:  Patient Active Problem List   Diagnosis Date Noted  . Encounter for planned induction of labor 01/10/2020  . SVD (spontaneous vaginal delivery) 01/10/2020  . Postpartum care following vaginal delivery 7/20 01/10/2020  . Perineal laceration, second degree 01/10/2020  .  IUGR (intrauterine growth restriction) affecting care of mother 01/09/2020  . IDA (iron deficiency anemia) 01/09/2020  . History of depression 01/09/2020  . 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 yo G1P1001 at [redacted]w[redacted]d was admitted to the hospital 01/09/2020 for induction of labor.  Indication for induction: IUGR @37  weeks.  Patient had an uncomplicated labor course as follows: Membrane Rupture Time/Date: 5:39 PM ,01/09/2020   Delivery Method:Vaginal, Spontaneous  Episiotomy: None  Lacerations:  2nd degree;Perineal  Details of delivery can be found in separate delivery note.  Patient had a routine postpartum course. Patient is discharged home 01/12/20.  Newborn Data: Birth date:01/10/2020  Birth time:9:03 AM  Gender:Female  Living status:Living  Apgars:9 ,9  Weight:2470 g  Postpartum Day # 2 : S/P NSVD due to IOL for IUGR. Pt admitted 7/19 and progress with Cytotec, foley, AROM and pitocin to have a NSVD. Pt had PP fever 100.9 on PPD#1, since afebrile after 24 hours Unasyn today. Afebrile now. Admitting hgb was 8.6, with qbl of 8/19, asymptomatic, post hgb was 7.2 received IV iron transfusion on 01/11/2020, with post hgb of 7.4, asymptomatic on iron PO.  Patient up ad  lib, denies syncope or dizziness. Reports consuming regular diet without issues and denies N/V. Patient reports 0 bowel movement + passing flatus.  Denies issues with urination and reports bleeding is "lighter."  Patient is breast and bottle feeding and reports going well.  Desires IUD for postpartum contraception.  Pain is being appropriately managed with use of po meds. H/O depression today mood happy and stable, denies SI/HI.   Physical exam  Vitals:   01/11/20 1530 01/11/20 1944 01/11/20 2132 01/12/20 0519  BP: 125/74 127/74 131/68 122/70  Pulse: 86 83 91 73  Resp: 20 17 16 15   Temp: 97.9 F (36.6 C) 97.9 F (36.6 C)  98.9 F (37.2 C) 97.9 F (36.6 C)  TempSrc: Oral Oral Oral Oral  SpO2: 100% 100% 99% 100%  Weight:      Height:       General: alert, cooperative and no distress Lochia: appropriate Uterine Fundus: firm Perineum: approximate, no hematomas noted.  DVT Evaluation: No evidence of DVT seen on physical exam. Negative Homan's sign. No cords or calf tenderness. No significant calf/ankle edema.  Labs: Lab Results  Component Value Date   WBC 10.3 01/12/2020   HGB 7.4 (L) 01/12/2020   HCT 24.5 (L) 01/12/2020   MCV 91.4 01/12/2020   PLT 268 01/12/2020   No flowsheet data found.  Date of discharge: 01/12/2020 Discharge Diagnoses: Term Pregnancy-delivered Discharge instruction: per After Visit Summary and "Baby and Me Booklet".  After visit meds:   Activity:           unrestricted and pelvic rest Advance as tolerated. Pelvic rest for 6 weeks.  Diet:                routine Medications: PNV, Ibuprofen, Colace and Iron Postpartum contraception: IUD Mirena 6 weeks PPV Condition:  Pt discharge to home with baby in stable Anemia: PO Iron daily.  H/O depression : Mood stable, no meds, f/u for mood check in 1-2 weeks with [facility], report SI/HI.   Meds: Allergies as of 01/12/2020   No Known Allergies     Medication List    TAKE these medications   ibuprofen 600 MG tablet Commonly known as: ADVIL Take 1 tablet (600 mg total) by mouth every 6 (six) hours.   iron polysaccharides 150 MG capsule Commonly known as: NIFEREX Take 1 capsule (150 mg total) by mouth daily.   PRENATAL VITAMIN PO Take 1 tablet by mouth daily.   senna-docusate 8.6-50 MG tablet Commonly known as: Senokot-S Take 2 tablets by mouth daily. Start taking on: January 13, 2020       Discharge Follow Up:   Follow-up Information    [facility]. Schedule an appointment as soon as possible for a visit in 1 week(s).   Specialty: Obstetrics and Gynecology Why: 1-2 week mood check and 6  weeks PPV Contact information: [address] [place] [phone]               [place], NP-C, CNM 01/12/2020, 7:48 AM  01/14/2020, FNP

## 2020-09-13 IMAGING — US US MFM OB LIMITED
1 series · 14 of 18 positions shown · non-contrast
Comparison: none

[Series 1: us mfm ob limited · 18 acquisitions, 14 frames shown]
[im 1/18]
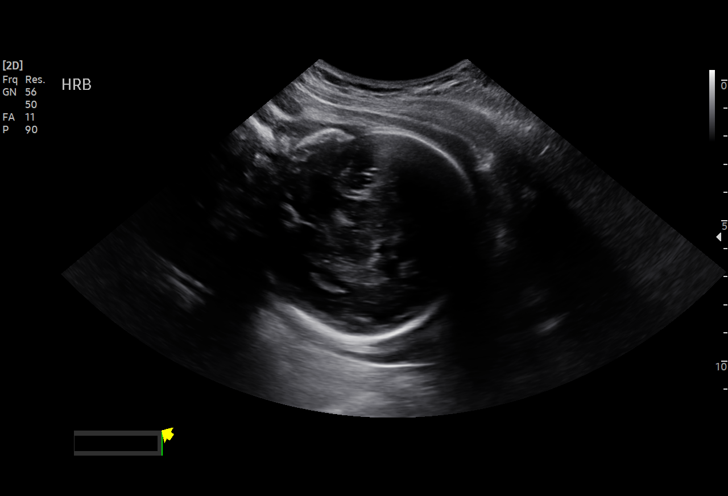
[im 2/18]
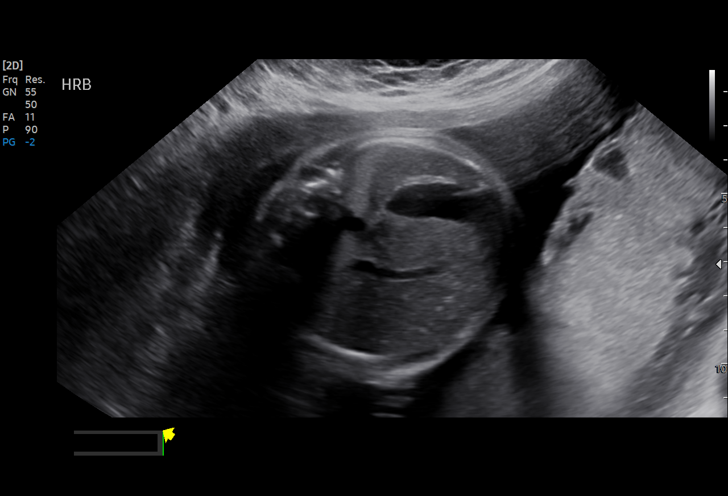
[im 4/18]
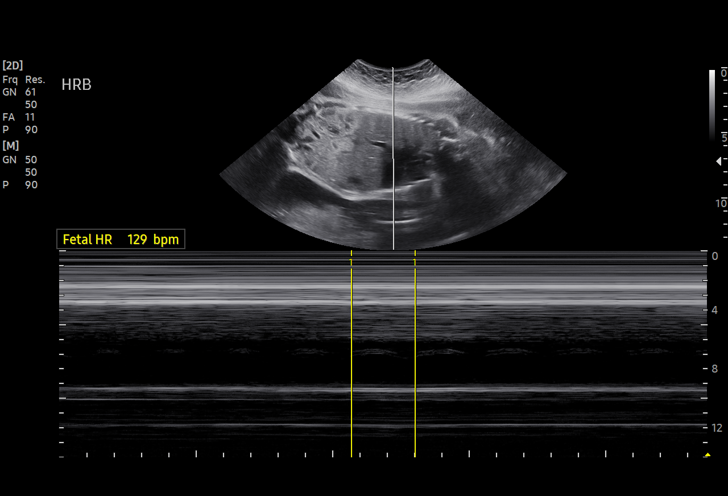
[im 5/18]
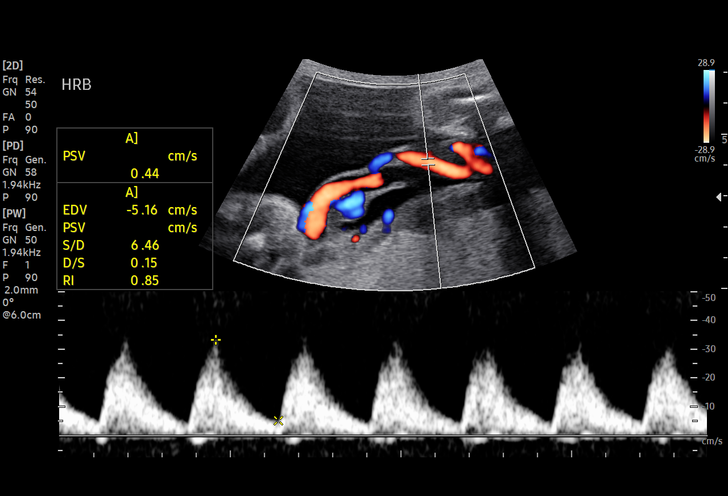
[im 6/18]
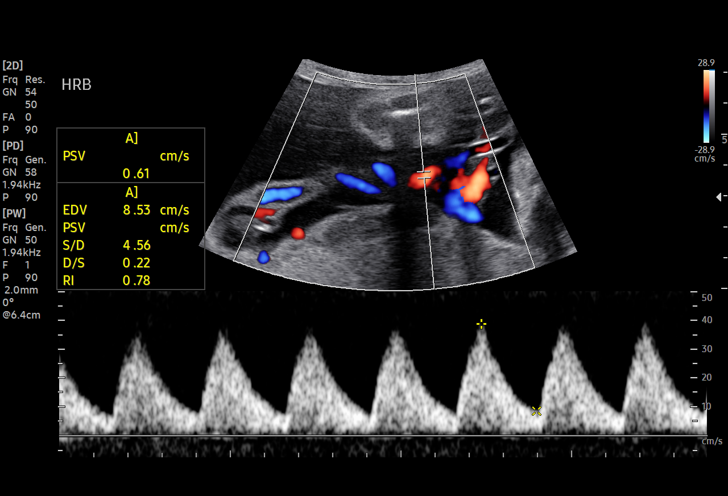
[im 8/18]
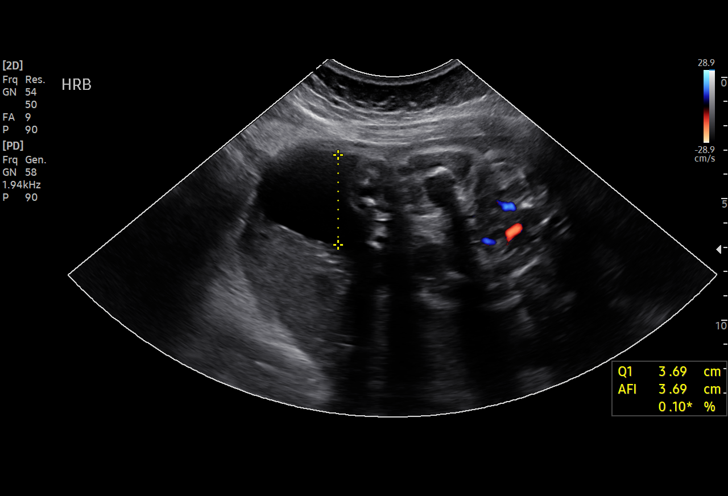
[im 9/18]
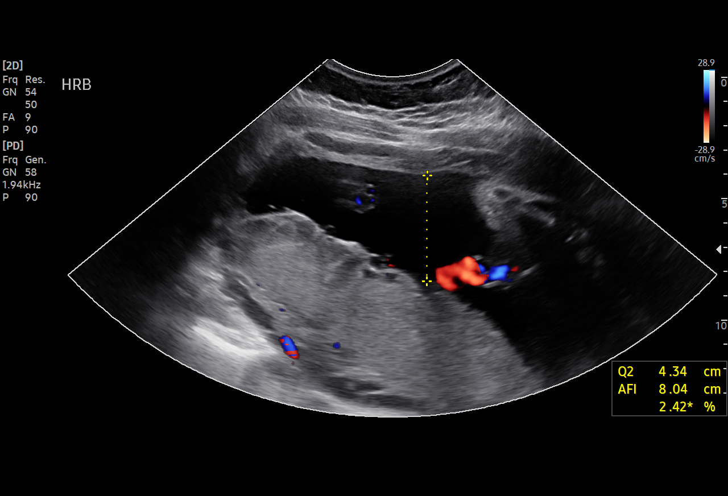
[im 10/18]
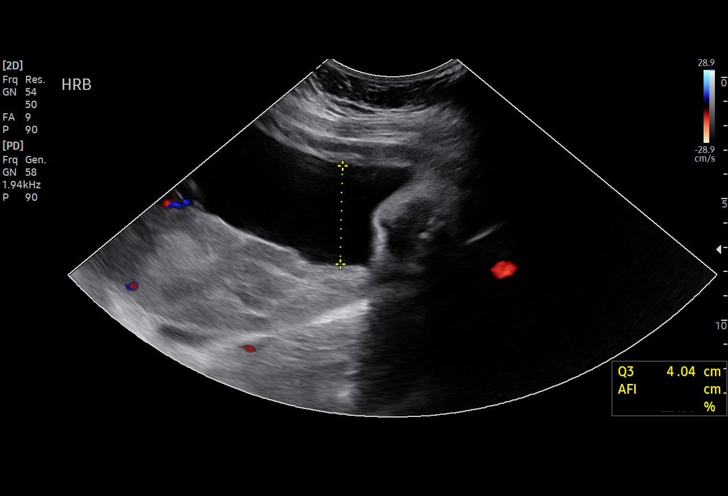
[im 11/18]
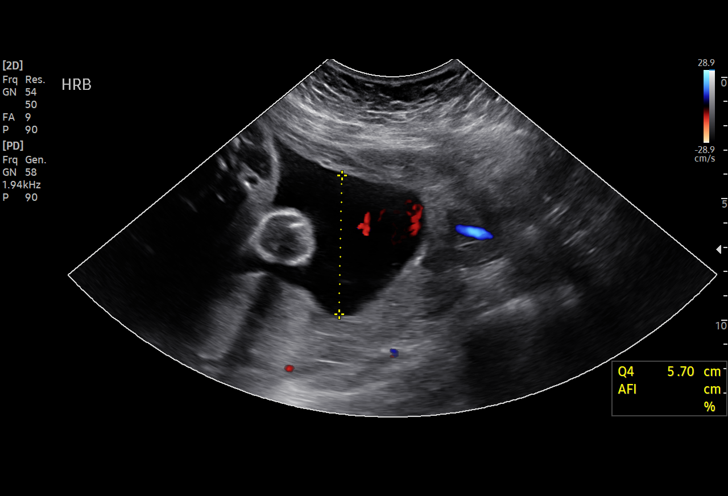
[im 13/18]
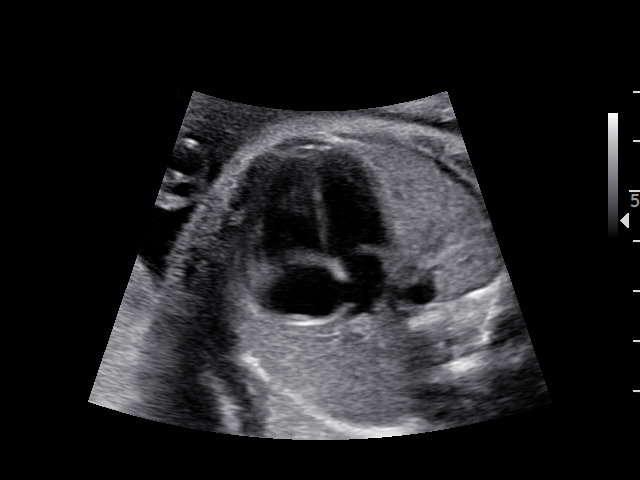
[im 14/18]
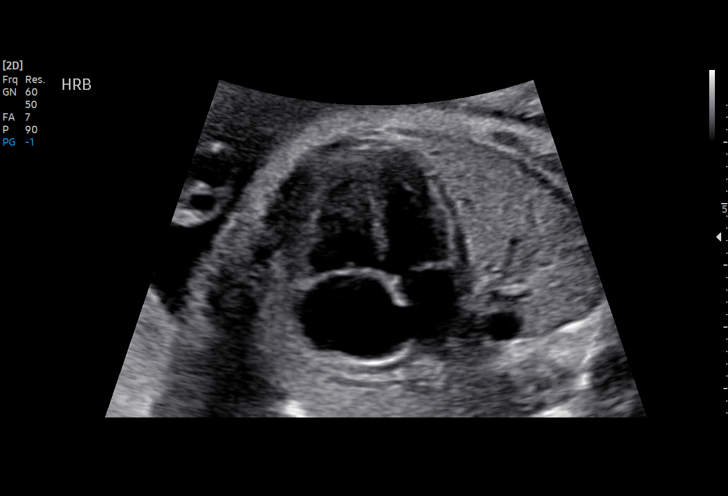
[im 15/18]
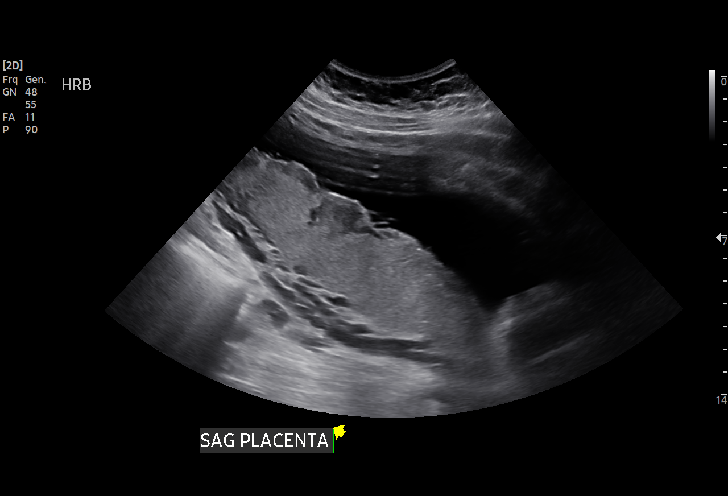
[im 17/18]
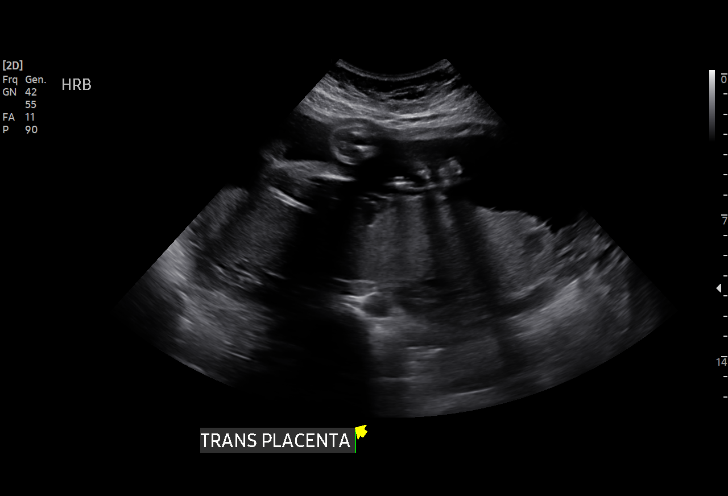
[im 18/18]
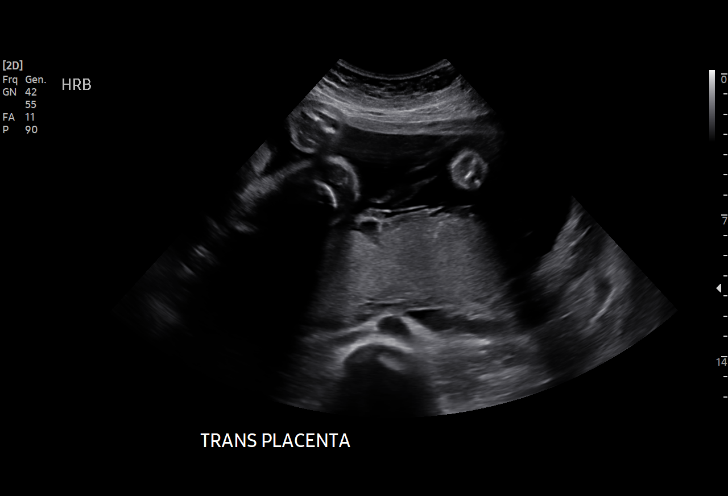

[14 of 18 positions shown; findings below may reference images not displayed]

Obstetrics &
                                                            Gynecology
                                                            [address]
                                                            [place].
                   CNM

Indications

 Abnormal finding on antenatal screening
 (pericardial effusion- RESOLVED, absent
 nasal bone)
 MS-AFP: Neg/   Panorama:Low risk, Male,
 FF:8.1
 Maternal care for known or suspected poor
 fetal growth, third trimester, not applicable or
 unspecified IUGR
 Obesity complicating pregnancy, third
 trimester
 29 weeks gestation of pregnancy
Vital Signs

                                                Height:        5'2"
Fetal Evaluation

 Num Of Fetuses:         1
 Fetal Heart Rate(bpm):  129
 Cardiac Activity:       Observed
 Presentation:           Cephalic
 Placenta:               Posterior
 Amniotic Fluid
 AFI FV:      Within normal limits

 AFI Sum(cm)     %Tile       Largest Pocket(cm)
 17.7            66

 RUQ(cm)       RLQ(cm)       LUQ(cm)        LLQ(cm)
 3.7           5.7           4.3            4
OB History

 Gravidity:    1
Gestational Age

 LMP:           29w 3d        Date:  04/25/19                 EDD:   01/30/20
 Best:          29w 3d     Det. By:  LMP  (04/25/19)          EDD:   01/30/20
Anatomy

 Heart:                 Appears normal         Bladder:                Appears normal
                        (4CH, axis, and
                        situs)
 Stomach:               Appears normal, left
                        sided
Doppler - Fetal Vessels

 Umbilical Artery
  S/D     %tile      RI                               PSV    ADFV    RDFV
                                                    (cm/s)
  5.05   > 97.5     0.8                              34.73      No      No

Cervix Uterus Adnexa

 Cervix
 Not visualized (advanced GA >96wks)
Impression

 Fetal growth restriction. Patient returned for umbilical artery
 (UA) Doppler study and NST.
 Amniotic fluid is normal and good fetal activity is seen.
 Umbilical artery Doppler showed increased S/D ratio (not
 absent-end-diastolic flow). No evidence of pericardial effusion.
 NST is reactive for this gestational age.
 I explained significance of UA Doppler study and reassured
 the patient of the findings.
Recommendations

 -Continue weekly BPP till delivery.
                 Chintoh, Abindau

## 2020-10-04 IMAGING — US US MFM UA CORD DOPPLER
1 series · 15 of 28 positions shown · non-contrast
Comparison: none

[Series 1: us mfm ua cord doppler · 31 acquisitions, 15 frames shown]
[im 1/31]
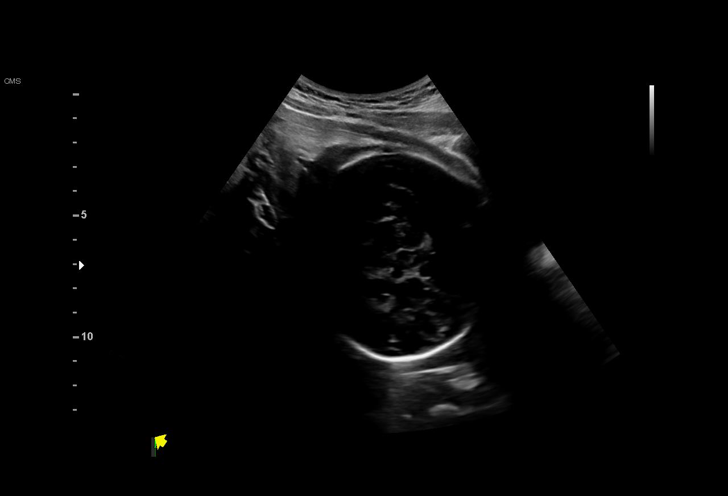
[im 3/31]
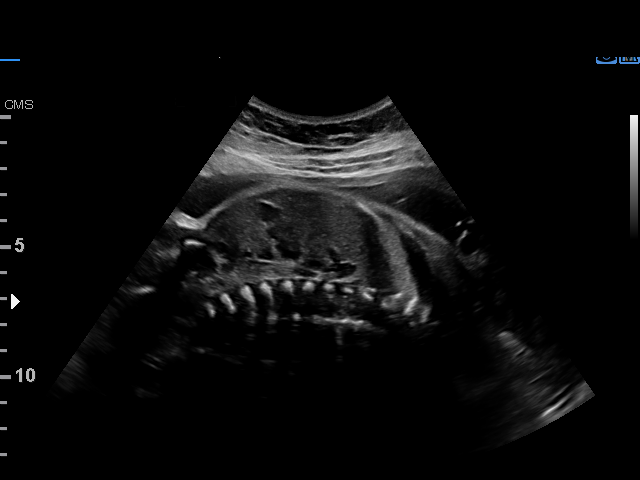
[im 5/31]
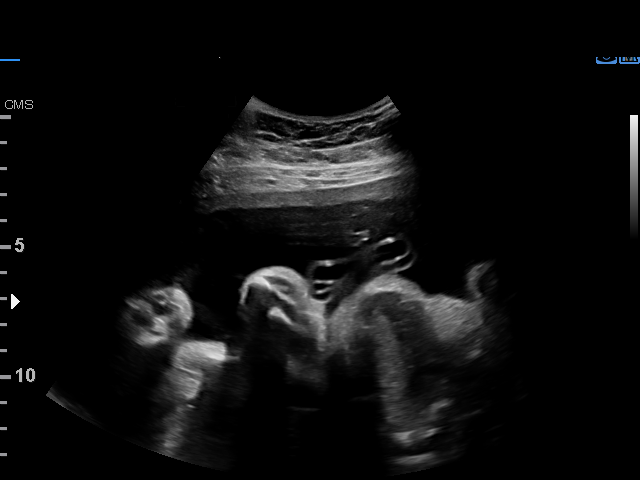
[im 7/31]
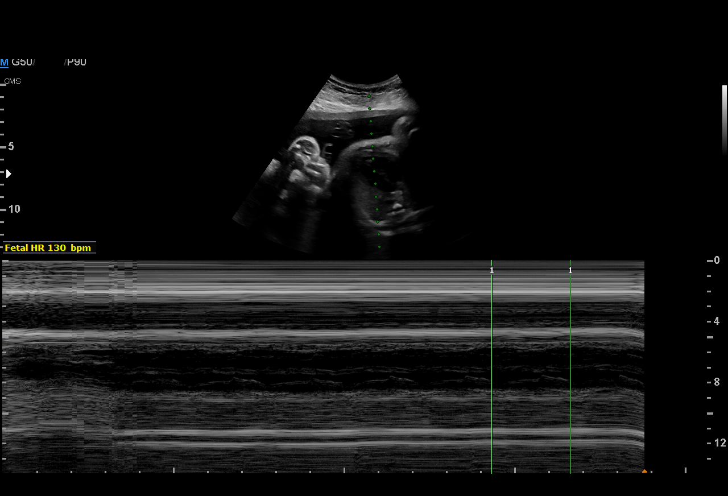
[im 9/31]
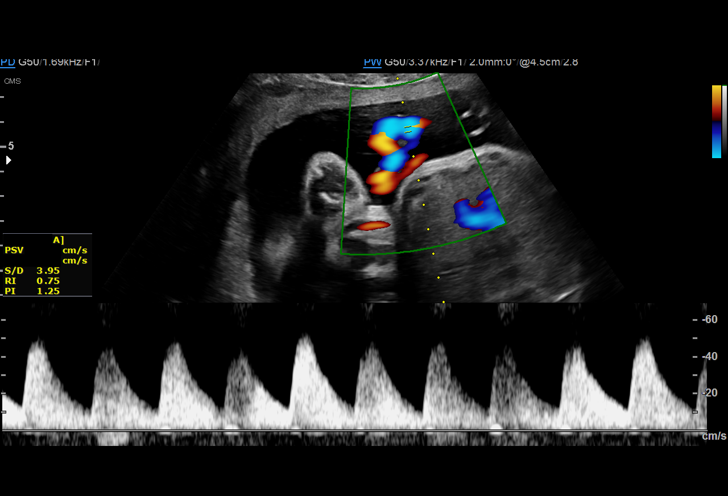
[im 12/31]
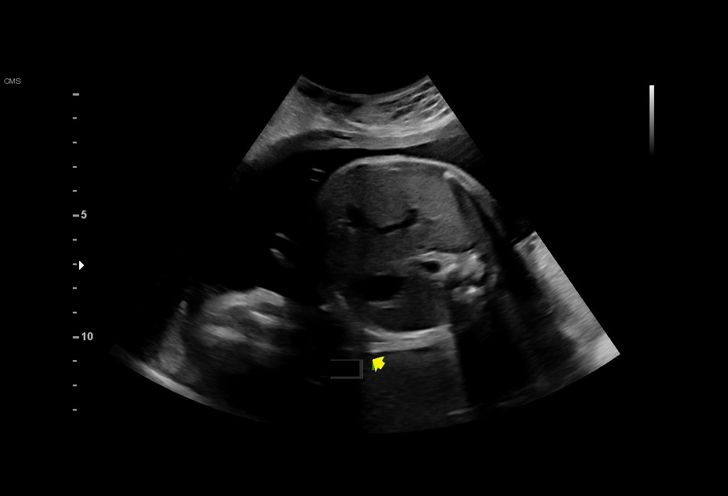
[im 14/31]
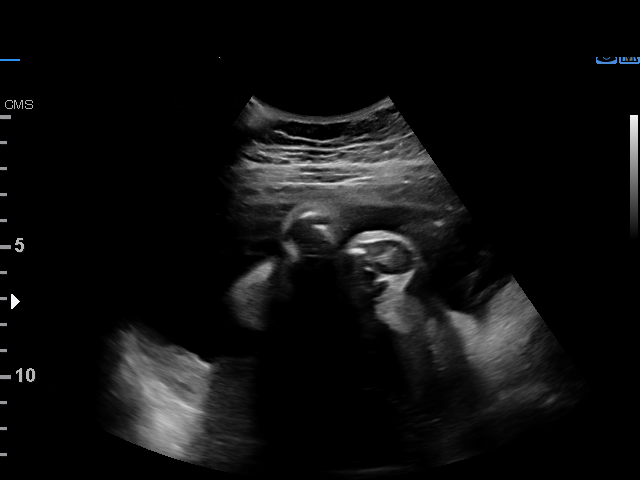
[im 16/31]
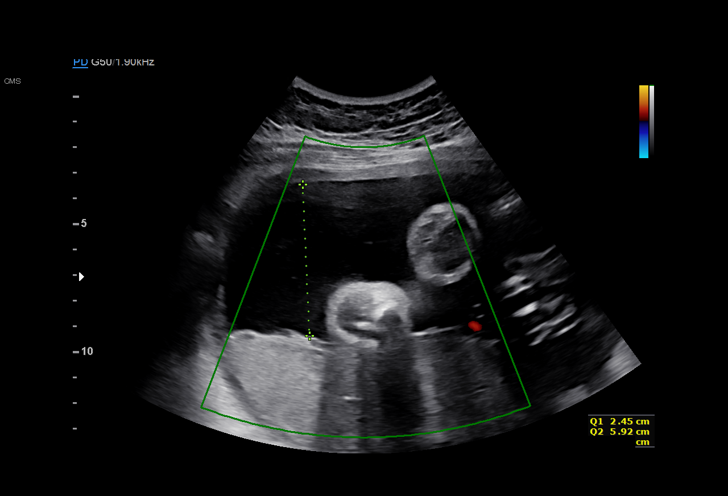
[im 17/31]
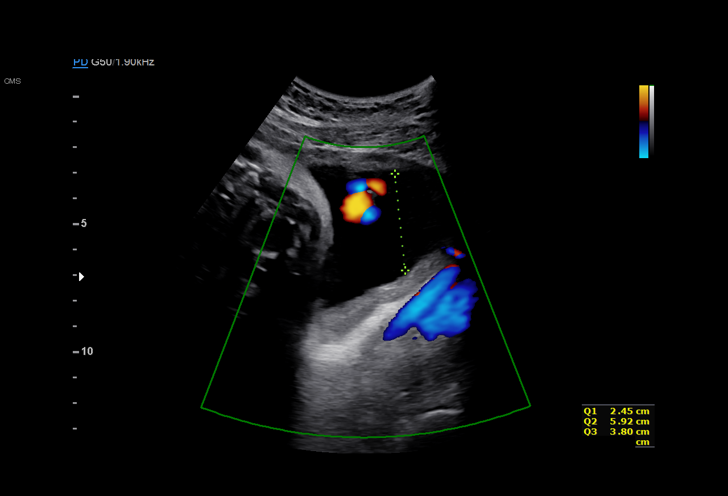
[im 19/31]
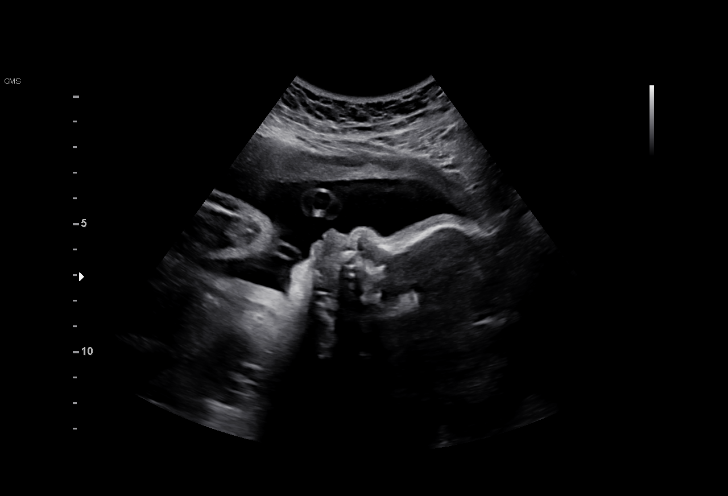
[im 22/31]
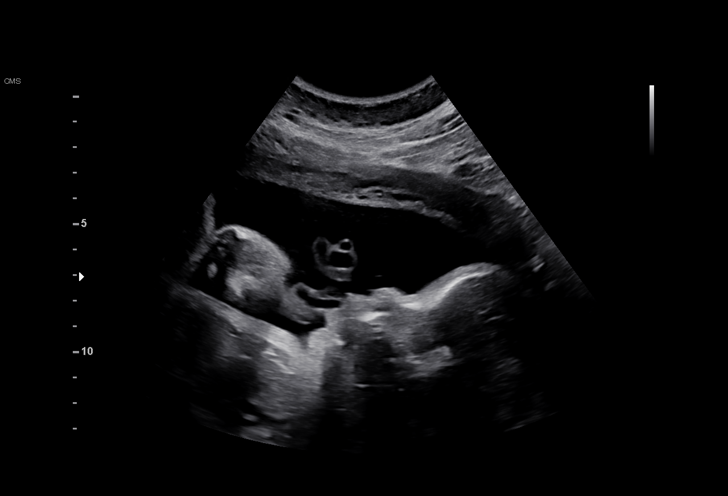
[im 24/31]
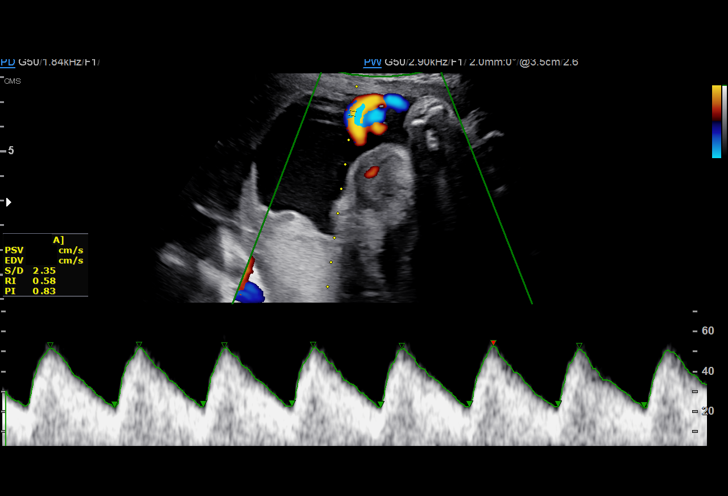
[im 26/31]
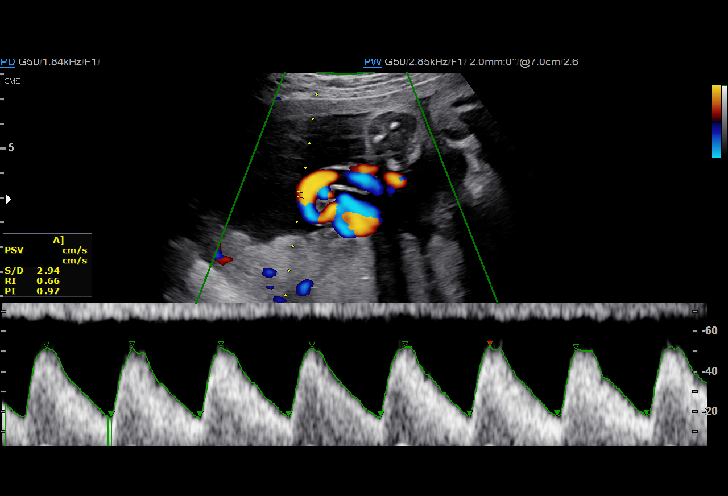
[im 28/31]
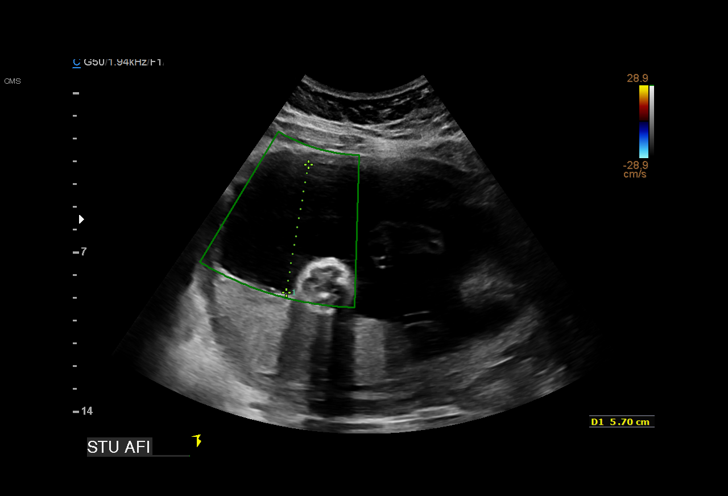
[im 31/31]
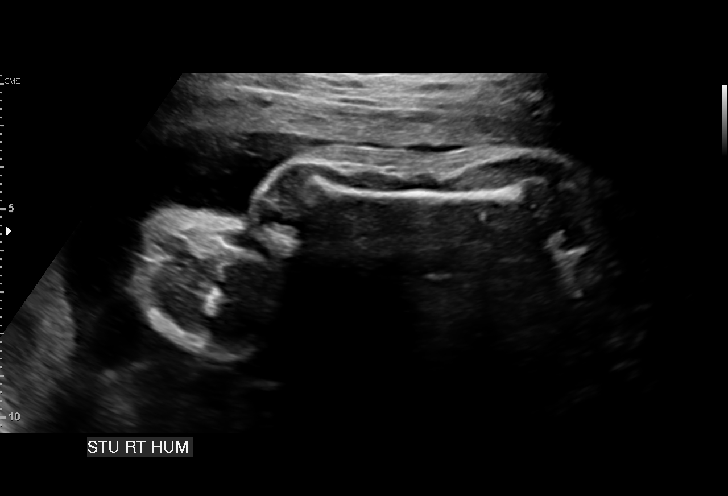

[15 of 28 positions shown; findings below may reference images not displayed]

Obstetrics &
                                                            Gynecology
                                                            [address]
                                                            [place].
                   CNM

 1  US MFM UA CORD DOPPLER                76820.02    CARLOS FREITAS
                                                      MOH
    MINADAKI/NONSTRESS                                       MOH

Indications

 32 weeks gestation of pregnancy
 Maternal care for known or suspected poor
 fetal growth, third trimester, not applicable or
 unspecified IUGR
 Abnormal finding on antenatal screening
 (pericardial effusion- RESOLVED, absent
 nasal bone)
 MS-AFP: Neg/   Panorama:Low risk, Male,
 FF:8.1
 Obesity complicating pregnancy, third
 trimester
Vital Signs

                                                Height:        5'2"
Fetal Evaluation

 Num Of Fetuses:         1
 Fetal Heart Rate(bpm):  130
 Cardiac Activity:       Observed
 Presentation:           Cephalic
 Amniotic Fluid
 AFI FV:      Within normal limits

 AFI Sum(cm)     %Tile       Largest Pocket(cm)
 17.4            64

 RUQ(cm)       RLQ(cm)       LUQ(cm)        LLQ(cm)

Biophysical Evaluation

 Amniotic F.V:   Within normal limits       F. Tone:        Observed
 F. Movement:    Observed                   N.S.T:          Reactive
 F. Breathing:   Observed                   Score:          [DATE]
OB History

 Gravidity:    1
Gestational Age

 LMP:           32w 3d        Date:  04/25/19                 EDD:   01/30/20
 Best:          32w 3d     Det. By:  LMP  (04/25/19)          EDD:   01/30/20
Doppler - Fetal Vessels

 Umbilical Artery
  S/D     %tile      RI    %tile      PI    %tile            ADFV    RDFV
   2.6       47    0.64       64    0.99       69               No      No

Impression

 Severe fetal growth restriction based on ultrasound
 performed on 11/23/2019 when the estimated fetal weight
 was at the 3rd percentile.
 Patient return for BPP and Doppler studies.
 Amniotic fluid is normal good fetal activity seen.  Antenatal
 testing is reassuring.  Umbilical artery Doppler showed
 normal forward diastolic flow.  NST is reactive.  BPP [DATE].
Recommendations

 -Continue weekly BPP, UA Doppler and NST till delivery.
 -Fetal growth next week.
                 Bhebhe, Rantona

## 2022-08-15 ENCOUNTER — Ambulatory Visit: Payer: Medicaid Other | Admitting: Nurse Practitioner

## 2022-08-15 ENCOUNTER — Telehealth: Payer: Self-pay | Admitting: General Practice

## 2022-08-15 NOTE — Telephone Encounter (Signed)
Pt was a no show for a NP app with Lauren on 08/15/22, I did not send a letter. Medicaid

## 2022-08-19 NOTE — Telephone Encounter (Signed)
Unable to RS at [facility]

## 2022-10-28 ENCOUNTER — Other Ambulatory Visit: Payer: Self-pay | Admitting: Family Medicine

## 2022-10-28 DIAGNOSIS — E049 Nontoxic goiter, unspecified: Secondary | ICD-10-CM

## 2022-12-02 ENCOUNTER — Ambulatory Visit
Admission: RE | Admit: 2022-12-02 | Discharge: 2022-12-02 | Disposition: A | Discharge status: Home / Self Care | Payer: Medicaid Other | Source: Ambulatory Visit | Attending: Family Medicine | Admitting: Family Medicine

## 2022-12-02 DIAGNOSIS — E049 Nontoxic goiter, unspecified: Secondary | ICD-10-CM

## 2023-08-02 ENCOUNTER — Encounter (HOSPITAL_COMMUNITY): Payer: Self-pay

## 2023-08-02 ENCOUNTER — Ambulatory Visit (HOSPITAL_COMMUNITY)
Admission: EM | Admit: 2023-08-02 | Discharge: 2023-08-02 | Disposition: A | Discharge status: Home / Self Care | Payer: Medicaid Other | Attending: Emergency Medicine | Admitting: Emergency Medicine

## 2023-08-02 DIAGNOSIS — Z3202 Encounter for pregnancy test, result negative: Secondary | ICD-10-CM

## 2023-08-02 LAB — POCT URINE PREGNANCY: Preg Test, Ur: NEGATIVE

## 2023-08-02 NOTE — ED Triage Notes (Signed)
 Here for UPT. Last cycle was 07/05/2023. No other symptoms.

## 2023-08-02 NOTE — ED Provider Notes (Signed)
 [facility]    CSN: [id number] Arrival date & time: 08/02/23  1022      History   Chief Complaint Chief Complaint  Patient presents with   Possible Pregnancy    HPI Nicole Stewart is a 28 y.o. female.   HPI   She states her pregnancy tracker indicated she was about 3 days late and she had intercourse around the beginning of Jan She states she is not having nausea, cramping, dizziness, or changes to vaginal discharge.  She is not currently using birth control.   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 Authorizing Provider  ibuprofen  (ADVIL ) 600 MG tablet Take 1 tablet (600 mg total) by mouth every 6 (six) hours. 01/12/20   [place] , Jade, FNP  iron  polysaccharides (NIFEREX) 150 MG capsule Take 1 capsule (150 mg total) by mouth daily. 01/12/20   [place] , Jade, FNP  Prenatal Vit-Fe Fumarate-FA (PRENATAL VITAMIN PO) Take 1 tablet by mouth daily.     [provider]  senna-docusate (SENOKOT-S) 8.6-50 MG tablet Take 2 tablets by mouth daily. 01/13/20   [place] , Jade, FNP    Family  History Family History  Problem Relation Age of Onset   Diabetes Mother    Diabetes Maternal Grandfather     Social History Social History   Tobacco Use   Smoking status: Never   Smokeless tobacco: Never  Vaping Use   Vaping status: Never Used  Substance Use Topics   Alcohol use: Never   Drug use: Never     Allergies   Patient has no known allergies.   Review of Systems Review of Systems  Genitourinary:  Positive for menstrual problem.     Physical Exam Triage Vital Signs ED Triage Vitals [08/02/23 1041]  Encounter Vitals Group     BP 120/73     Systolic BP Percentile      Diastolic BP Percentile      Pulse Rate 83     Resp 16     Temp 98.2 F (36.8 C)     Temp Source Oral     SpO2 95 %     Weight      Height      Head Circumference      Peak Flow      Pain Score  Pain Loc      Pain Education      Exclude from Growth Chart    No data found.  Updated Vital Signs BP 120/73 (BP Location: Left Arm)   Pulse 83   Temp 98.2 F (36.8 C) (Oral)   Resp 16   LMP 07/05/2023   SpO2 95%   Visual Acuity Right Eye Distance:   Left Eye Distance:   Bilateral Distance:    Right Eye Near:   Left Eye Near:    Bilateral Near:     Physical Exam Vitals reviewed.  Constitutional:      General: She is awake.     Appearance: Normal appearance. She is well-developed and well-groomed.  HENT:     Head: Normocephalic and atraumatic.  Eyes:     General: Lids are normal. Gaze aligned appropriately.     Extraocular Movements: Extraocular movements intact.     Conjunctiva/sclera: Conjunctivae normal.  Pulmonary:     Effort: Pulmonary effort is normal.  Neurological:     General: No focal deficit present.     Mental Status: She is alert and oriented to person, place, and time.     GCS: GCS eye subscore is 4. GCS verbal subscore is 5. GCS motor subscore is 6.     Cranial Nerves: No cranial nerve deficit, dysarthria or facial asymmetry.  Psychiatric:         Attention and Perception: Attention and perception normal.        Mood and Affect: Mood and affect normal.        Speech: Speech normal.        Behavior: Behavior normal. Behavior is cooperative.      UC Treatments / Results  Labs (all labs ordered are listed, but only abnormal results are displayed) Labs Reviewed  POCT URINE PREGNANCY    EKG   Radiology No results found.  Procedures Procedures (including critical care time)  Medications Ordered in UC Medications - No data to display  Initial Impression / Assessment and Plan / UC Course  I have reviewed the triage vital signs and the nursing notes.  Pertinent labs & imaging results that were available during my care of the patient were reviewed by me and considered in my medical decision making (see chart for details).      Final Clinical Impressions(s) / UC Diagnoses   Final diagnoses:  Negative pregnancy test  Pregnancy examination or test, negative result   Patient presents today because she is concerned for potential pregnancy.  Urine pregnancy test was negative.  Results were reviewed with patient during the appointment.  Patient does not desire to become pregnant at this time.  We reviewed that she should discuss birth control options with her PCP at their next follow-up appointment.  We reviewed that there is over-the-counter birth control options and that she should use a barrier method such as condoms until she has a definitive birth control plan in place.  Recommend close follow-up with PCP for ongoing management.    Discharge Instructions      Please speak with your PCP regarding starting birth control.  There is also an over-the-counter daily birth control available now.  While you are waiting to be started on birth control please use a barrier method such as condoms for pregnancy and STD prevention.     ED Prescriptions   None    PDMP not reviewed this encounter.   Achillies Buehl, Rocky BRAVO, PA-C 08/02/23  1104

## 2023-08-02 NOTE — Discharge Instructions (Addendum)
 Please speak with your PCP regarding starting birth control.  There is also an over-the-counter daily birth control available now.  While you are waiting to be started on birth control please use a barrier method such as condoms for pregnancy and STD prevention.
